# Patient Record
Sex: Female | Born: 1990 | Race: White | Hispanic: No | Marital: Single | Smoking: Never smoker
Health system: Southern US, Community
[De-identification: ages and names within clinical notes are randomized; demographics above are authoritative.]

## PROBLEM LIST (undated history)

## (undated) ENCOUNTER — Inpatient Hospital Stay: Payer: Self-pay

## (undated) DIAGNOSIS — O24419 Gestational diabetes mellitus in pregnancy, unspecified control: Secondary | ICD-10-CM

## (undated) DIAGNOSIS — C801 Malignant (primary) neoplasm, unspecified: Secondary | ICD-10-CM

## (undated) DIAGNOSIS — C439 Malignant melanoma of skin, unspecified: Secondary | ICD-10-CM

## (undated) DIAGNOSIS — R638 Other symptoms and signs concerning food and fluid intake: Secondary | ICD-10-CM

## (undated) HISTORY — DX: Malignant (primary) neoplasm, unspecified: C80.1

## (undated) HISTORY — PX: MELANOMA EXCISION: SHX5266

## (undated) HISTORY — DX: Malignant melanoma of skin, unspecified: C43.9

## (undated) HISTORY — DX: Other symptoms and signs concerning food and fluid intake: R63.8

## (undated) HISTORY — DX: Gestational diabetes mellitus in pregnancy, unspecified control: O24.419

---
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
## 2010-02-23 ENCOUNTER — Emergency Department: Payer: Self-pay | Admitting: Emergency Medicine

## 2011-10-11 ENCOUNTER — Emergency Department: Payer: Self-pay | Admitting: Emergency Medicine

## 2011-10-11 LAB — URINALYSIS, COMPLETE
Bacteria: NONE SEEN
Ketone: NEGATIVE
Ph: 6 (ref 4.5–8.0)
Protein: NEGATIVE
Squamous Epithelial: 4

## 2011-10-11 LAB — BASIC METABOLIC PANEL
BUN: 11 mg/dL (ref 7–18)
Calcium, Total: 8.2 mg/dL — ABNORMAL LOW (ref 8.5–10.1)
Chloride: 100 mmol/L (ref 98–107)
Co2: 25 mmol/L (ref 21–32)
EGFR (Non-African Amer.): 52 — ABNORMAL LOW
Potassium: 3 mmol/L — ABNORMAL LOW (ref 3.5–5.1)
Sodium: 134 mmol/L — ABNORMAL LOW (ref 136–145)

## 2011-10-11 LAB — CBC
HGB: 10.1 g/dL — ABNORMAL LOW (ref 12.0–16.0)
MCH: 30.9 pg (ref 26.0–34.0)
MCHC: 33.4 g/dL (ref 32.0–36.0)

## 2011-10-12 LAB — PREGNANCY, URINE: Pregnancy Test, Urine: NEGATIVE m[IU]/mL

## 2011-10-14 LAB — URINE CULTURE

## 2012-04-27 ENCOUNTER — Emergency Department: Payer: Self-pay | Admitting: Emergency Medicine

## 2012-07-12 DIAGNOSIS — F329 Major depressive disorder, single episode, unspecified: Secondary | ICD-10-CM | POA: Insufficient documentation

## 2013-02-24 ENCOUNTER — Emergency Department: Payer: Self-pay | Admitting: Emergency Medicine

## 2013-02-24 LAB — URINALYSIS, COMPLETE
Glucose,UR: NEGATIVE mg/dL (ref 0–75)
Ketone: NEGATIVE
Nitrite: NEGATIVE
Protein: 100
RBC,UR: 62 /HPF (ref 0–5)
Specific Gravity: 1.017 (ref 1.003–1.030)
Squamous Epithelial: 9
WBC UR: 4166 /HPF (ref 0–5)

## 2013-02-27 ENCOUNTER — Ambulatory Visit: Payer: Self-pay | Admitting: Family Medicine

## 2014-05-16 NOTE — L&D Delivery Note (Signed)
Delivery Note At 10:06 PM a viable female was delivered via  (Presentation: Left Occiput Anterior).  APGAR:8 ,9 ; weight  .   Placenta status: Intact, Spontaneous.  Cord: 3 vessels with the following complications: None.  Cord pH: NA  Anesthesia: Epidural  Episiotomy: None Lacerations: Periurethral Suture Repair: NA Est. Blood Loss (mL):  350 mL  Mom to postpartum.  Baby to Couplet care / Skin to Skin.  Hassell Done A Defrancesco 01/30/2015, 10:20 PM

## 2014-11-03 ENCOUNTER — Other Ambulatory Visit: Payer: Self-pay

## 2014-11-03 DIAGNOSIS — O4443 Low lying placenta NOS or without hemorrhage, third trimester: Secondary | ICD-10-CM

## 2014-11-03 DIAGNOSIS — O283 Abnormal ultrasonic finding on antenatal screening of mother: Secondary | ICD-10-CM

## 2014-11-07 ENCOUNTER — Other Ambulatory Visit: Payer: BLUE CROSS/BLUE SHIELD

## 2014-11-07 DIAGNOSIS — O4443 Low lying placenta NOS or without hemorrhage, third trimester: Secondary | ICD-10-CM

## 2014-11-07 DIAGNOSIS — O283 Abnormal ultrasonic finding on antenatal screening of mother: Secondary | ICD-10-CM | POA: Diagnosis not present

## 2014-11-07 DIAGNOSIS — O4403 Placenta previa specified as without hemorrhage, third trimester: Secondary | ICD-10-CM | POA: Diagnosis not present

## 2014-11-13 ENCOUNTER — Ambulatory Visit (INDEPENDENT_AMBULATORY_CARE_PROVIDER_SITE_OTHER): Payer: BLUE CROSS/BLUE SHIELD | Admitting: Obstetrics and Gynecology

## 2014-11-13 ENCOUNTER — Encounter: Payer: Self-pay | Admitting: Obstetrics and Gynecology

## 2014-11-13 VITALS — BP 98/62 | HR 79 | Wt 182.0 lb

## 2014-11-13 DIAGNOSIS — Z130 Encounter for screening for diseases of the blood and blood-forming organs and certain disorders involving the immune mechanism: Secondary | ICD-10-CM

## 2014-11-13 DIAGNOSIS — Z131 Encounter for screening for diabetes mellitus: Secondary | ICD-10-CM

## 2014-11-13 DIAGNOSIS — Z3493 Encounter for supervision of normal pregnancy, unspecified, third trimester: Secondary | ICD-10-CM

## 2014-11-13 LAB — POCT URINALYSIS DIPSTICK
Bilirubin, UA: NEGATIVE
Blood, UA: NEGATIVE
GLUCOSE UA: NEGATIVE
Ketones, UA: NEGATIVE
Nitrite, UA: NEGATIVE
PROTEIN UA: NEGATIVE
Spec Grav, UA: 1.01
UROBILINOGEN UA: 0.2
pH, UA: 7.5

## 2014-11-13 MED ORDER — TETANUS-DIPHTH-ACELL PERTUSSIS 5-2.5-18.5 LF-MCG/0.5 IM SUSP
0.5000 mL | Freq: Once | INTRAMUSCULAR | Status: AC
  Administered 2014-11-13: 0.5 mL via INTRAMUSCULAR

## 2014-11-13 NOTE — Progress Notes (Signed)
No compaints.GTT and H/H today.

## 2014-11-15 LAB — HEMOGLOBIN AND HEMATOCRIT, BLOOD
Hematocrit: 34.4 % (ref 34.0–46.6)
Hemoglobin: 11.4 g/dL (ref 11.1–15.9)

## 2014-11-15 LAB — GLUCOSE, 1 HOUR GESTATIONAL: Gestational Diabetes Screen: 100 mg/dL (ref 65–139)

## 2014-11-27 ENCOUNTER — Ambulatory Visit (INDEPENDENT_AMBULATORY_CARE_PROVIDER_SITE_OTHER): Payer: BLUE CROSS/BLUE SHIELD | Admitting: Obstetrics and Gynecology

## 2014-11-27 ENCOUNTER — Encounter: Payer: Self-pay | Admitting: Obstetrics and Gynecology

## 2014-11-27 VITALS — BP 105/68 | HR 74 | Wt 181.1 lb

## 2014-11-27 DIAGNOSIS — Z1389 Encounter for screening for other disorder: Secondary | ICD-10-CM

## 2014-11-27 DIAGNOSIS — Z331 Pregnant state, incidental: Secondary | ICD-10-CM

## 2014-11-27 LAB — POCT URINALYSIS DIPSTICK
BILIRUBIN UA: NEGATIVE
Blood, UA: NEGATIVE
Glucose, UA: NEGATIVE
Ketones, UA: NEGATIVE
LEUKOCYTES UA: NEGATIVE
NITRITE UA: NEGATIVE
PH UA: 6.5
Protein, UA: NEGATIVE
SPEC GRAV UA: 1.02
Urobilinogen, UA: NEGATIVE

## 2014-11-27 NOTE — Progress Notes (Signed)
Glucola results 100; H&H: wnl.

## 2014-12-10 ENCOUNTER — Ambulatory Visit (INDEPENDENT_AMBULATORY_CARE_PROVIDER_SITE_OTHER): Payer: BLUE CROSS/BLUE SHIELD | Admitting: Obstetrics and Gynecology

## 2014-12-10 VITALS — BP 111/72 | HR 79 | Wt 181.3 lb

## 2014-12-10 DIAGNOSIS — Z3493 Encounter for supervision of normal pregnancy, unspecified, third trimester: Secondary | ICD-10-CM

## 2014-12-10 DIAGNOSIS — O4703 False labor before 37 completed weeks of gestation, third trimester: Secondary | ICD-10-CM

## 2014-12-10 DIAGNOSIS — Z331 Pregnant state, incidental: Secondary | ICD-10-CM

## 2014-12-10 DIAGNOSIS — Z1389 Encounter for screening for other disorder: Secondary | ICD-10-CM

## 2014-12-10 LAB — POCT URINALYSIS DIPSTICK
BILIRUBIN UA: NEGATIVE
Blood, UA: NEGATIVE
Glucose, UA: NEGATIVE
KETONES UA: NEGATIVE
Nitrite, UA: NEGATIVE
PROTEIN UA: NEGATIVE
Spec Grav, UA: 1.01
UROBILINOGEN UA: NEGATIVE
pH, UA: 6.5

## 2014-12-11 ENCOUNTER — Other Ambulatory Visit: Payer: Self-pay | Admitting: Obstetrics and Gynecology

## 2014-12-12 DIAGNOSIS — O4703 False labor before 37 completed weeks of gestation, third trimester: Secondary | ICD-10-CM | POA: Insufficient documentation

## 2014-12-12 LAB — FETAL FIBRONECTIN: Fetal Fibronectin: NEGATIVE

## 2014-12-12 NOTE — Progress Notes (Signed)
Having irregular contractions; cervix: Fingertip/30%/-3/soft; fetal fibronectin obtained.  Preterm labor precautions given. Return in 2 weeks.

## 2014-12-17 ENCOUNTER — Telehealth: Payer: Self-pay | Admitting: Obstetrics and Gynecology

## 2014-12-17 NOTE — Telephone Encounter (Signed)
Pt has pain lower left pelvic pain. Had to leave work, can't hardly walk with the pain. 34 wks .  Please call pt with advice

## 2014-12-18 NOTE — Telephone Encounter (Signed)
Left message to contact office and ask to speak to me. I called L&D and they have no record of her coming in yesterday or today.

## 2014-12-18 NOTE — Telephone Encounter (Signed)
Left message to contact office and for staff to get me to phone when she calls.

## 2014-12-19 NOTE — Telephone Encounter (Signed)
Left message for pt that I was just calling to check on her and that I see she has an appt next week. Please call office if there is anything that we can help with.

## 2014-12-25 ENCOUNTER — Ambulatory Visit (INDEPENDENT_AMBULATORY_CARE_PROVIDER_SITE_OTHER): Payer: BLUE CROSS/BLUE SHIELD | Admitting: Obstetrics and Gynecology

## 2014-12-25 ENCOUNTER — Encounter: Payer: Self-pay | Admitting: Obstetrics and Gynecology

## 2014-12-25 VITALS — BP 102/66 | HR 68 | Ht 66.0 in | Wt 182.9 lb

## 2014-12-25 DIAGNOSIS — Z3493 Encounter for supervision of normal pregnancy, unspecified, third trimester: Secondary | ICD-10-CM

## 2014-12-25 DIAGNOSIS — O26893 Other specified pregnancy related conditions, third trimester: Secondary | ICD-10-CM

## 2014-12-25 DIAGNOSIS — N949 Unspecified condition associated with female genital organs and menstrual cycle: Secondary | ICD-10-CM

## 2014-12-25 LAB — POCT URINALYSIS DIPSTICK
Bilirubin, UA: NEGATIVE
Blood, UA: NEGATIVE
GLUCOSE UA: NEGATIVE
Ketones, UA: NEGATIVE
Leukocytes, UA: NEGATIVE
Nitrite, UA: NEGATIVE
PH UA: 7.5
PROTEIN UA: NEGATIVE
SPEC GRAV UA: 1.01
Urobilinogen, UA: 0.2

## 2014-12-25 NOTE — Progress Notes (Signed)
ROB: Patient still notes irregular contractions.  Also reports worsening pain on sides while working during the day. Advised on pregnancy girdle. Cervix unchanged from prior exam. Notes numbness in hands and feet when waking, likely secondary to swelling.  RTC in 2 weeks. For 36 week labs at that time.  PTL precautions given.

## 2014-12-25 NOTE — Progress Notes (Signed)
Pt c/o of numbness in hands and feet. Pelvic pressure.

## 2015-01-04 ENCOUNTER — Observation Stay
Admission: EM | Admit: 2015-01-04 | Discharge: 2015-01-04 | Disposition: A | Discharge status: Home / Self Care | Payer: BLUE CROSS/BLUE SHIELD | Attending: Obstetrics and Gynecology | Admitting: Obstetrics and Gynecology

## 2015-01-04 ENCOUNTER — Encounter: Payer: Self-pay | Admitting: *Deleted

## 2015-01-04 DIAGNOSIS — Z3A36 36 weeks gestation of pregnancy: Secondary | ICD-10-CM | POA: Diagnosis not present

## 2015-01-04 DIAGNOSIS — O4400 Placenta previa specified as without hemorrhage, unspecified trimester: Secondary | ICD-10-CM

## 2015-01-04 DIAGNOSIS — R638 Other symptoms and signs concerning food and fluid intake: Secondary | ICD-10-CM | POA: Diagnosis not present

## 2015-01-04 DIAGNOSIS — O283 Abnormal ultrasonic finding on antenatal screening of mother: Secondary | ICD-10-CM

## 2015-01-04 DIAGNOSIS — Z3490 Encounter for supervision of normal pregnancy, unspecified, unspecified trimester: Secondary | ICD-10-CM

## 2015-01-04 NOTE — Progress Notes (Signed)
Report given to Dr. Enzo Bi.  Order received.

## 2015-01-04 NOTE — Progress Notes (Signed)
Pt given d/c inst. Verbalized understanding and was then d/c home in stable condition with husband.

## 2015-01-04 NOTE — Discharge Instructions (Signed)
Keep next appointment at office on Thursday.  Drink additional water .

## 2015-01-05 NOTE — Progress Notes (Signed)
NST INTERPRETATION:  Indications:  1.  36 week IUP with contractions  Mode: External Baseline Rate (A): 130 bpm Variability: Moderate Accelerations: 15 x 15 Decelerations: None     Contraction Frequency (min): Occasional   Impression:  1.  36 week IUP with contractions. 2.  Reactive NST. 3.  No labor.   Plan:  1.  Labor precautions. 2.  Keep appointment as scheduled    Brayton Mars, MD

## 2015-01-08 ENCOUNTER — Ambulatory Visit (INDEPENDENT_AMBULATORY_CARE_PROVIDER_SITE_OTHER): Payer: BLUE CROSS/BLUE SHIELD | Admitting: Obstetrics and Gynecology

## 2015-01-08 ENCOUNTER — Encounter: Payer: Self-pay | Admitting: Obstetrics and Gynecology

## 2015-01-08 VITALS — BP 125/80 | HR 84 | Wt 189.8 lb

## 2015-01-08 DIAGNOSIS — Z3493 Encounter for supervision of normal pregnancy, unspecified, third trimester: Secondary | ICD-10-CM

## 2015-01-08 DIAGNOSIS — Z113 Encounter for screening for infections with a predominantly sexual mode of transmission: Secondary | ICD-10-CM

## 2015-01-08 DIAGNOSIS — Z36 Encounter for antenatal screening of mother: Secondary | ICD-10-CM

## 2015-01-08 DIAGNOSIS — Z3685 Encounter for antenatal screening for Streptococcus B: Secondary | ICD-10-CM

## 2015-01-08 LAB — POCT URINALYSIS DIPSTICK
Bilirubin, UA: NEGATIVE
Blood, UA: NEGATIVE
GLUCOSE UA: NEGATIVE
Ketones, UA: NEGATIVE
Nitrite, UA: NEGATIVE
PROTEIN UA: NEGATIVE
Spec Grav, UA: 1.01
UROBILINOGEN UA: 0.2
pH, UA: 6

## 2015-01-08 NOTE — Progress Notes (Signed)
36 week cultures today. Increased in d/c.

## 2015-01-11 LAB — STREP GP B NAA: STREP GROUP B AG: NEGATIVE

## 2015-01-12 LAB — GC/CHLAMYDIA PROBE AMP
CHLAMYDIA, DNA PROBE: NEGATIVE
NEISSERIA GONORRHOEAE BY PCR: NEGATIVE

## 2015-01-15 ENCOUNTER — Ambulatory Visit (INDEPENDENT_AMBULATORY_CARE_PROVIDER_SITE_OTHER): Payer: BLUE CROSS/BLUE SHIELD | Admitting: Obstetrics and Gynecology

## 2015-01-15 VITALS — BP 117/77 | HR 76 | Wt 186.4 lb

## 2015-01-15 DIAGNOSIS — Z3493 Encounter for supervision of normal pregnancy, unspecified, third trimester: Secondary | ICD-10-CM

## 2015-01-15 DIAGNOSIS — Z331 Pregnant state, incidental: Secondary | ICD-10-CM

## 2015-01-15 DIAGNOSIS — Z1389 Encounter for screening for other disorder: Secondary | ICD-10-CM

## 2015-01-15 LAB — POCT URINALYSIS DIPSTICK
BILIRUBIN UA: NEGATIVE
Glucose, UA: NEGATIVE
Ketones, UA: NEGATIVE
NITRITE UA: NEGATIVE
PH UA: 7
Protein, UA: NEGATIVE
RBC UA: NEGATIVE
SPEC GRAV UA: 1.015
Urobilinogen, UA: NEGATIVE

## 2015-01-15 NOTE — Progress Notes (Signed)
No changes since last visit. 

## 2015-01-22 ENCOUNTER — Encounter: Payer: Self-pay | Admitting: Obstetrics and Gynecology

## 2015-01-22 ENCOUNTER — Ambulatory Visit (INDEPENDENT_AMBULATORY_CARE_PROVIDER_SITE_OTHER): Payer: BLUE CROSS/BLUE SHIELD | Admitting: Obstetrics and Gynecology

## 2015-01-22 VITALS — BP 122/80 | HR 84 | Wt 188.7 lb

## 2015-01-22 DIAGNOSIS — Z3493 Encounter for supervision of normal pregnancy, unspecified, third trimester: Secondary | ICD-10-CM

## 2015-01-22 LAB — POCT URINALYSIS DIPSTICK
BILIRUBIN UA: NEGATIVE
GLUCOSE UA: NEGATIVE
KETONES UA: NEGATIVE
Nitrite, UA: NEGATIVE
PH UA: 6
Protein, UA: NEGATIVE
RBC UA: NEGATIVE
Spec Grav, UA: 1.02
Urobilinogen, UA: 0.2

## 2015-01-22 NOTE — Progress Notes (Signed)
No complaints.Cervix is thinning and less posterior; labor precautions

## 2015-01-29 ENCOUNTER — Ambulatory Visit (INDEPENDENT_AMBULATORY_CARE_PROVIDER_SITE_OTHER): Payer: BLUE CROSS/BLUE SHIELD | Admitting: Obstetrics and Gynecology

## 2015-01-29 ENCOUNTER — Inpatient Hospital Stay
Admission: EM | Admit: 2015-01-29 | Discharge: 2015-02-01 | DRG: 775 | Disposition: A | Discharge status: Home / Self Care | Payer: BLUE CROSS/BLUE SHIELD | Attending: Obstetrics and Gynecology | Admitting: Obstetrics and Gynecology

## 2015-01-29 ENCOUNTER — Encounter: Payer: Self-pay | Admitting: Obstetrics and Gynecology

## 2015-01-29 VITALS — BP 128/91 | HR 71 | Wt 193.9 lb

## 2015-01-29 DIAGNOSIS — Z3490 Encounter for supervision of normal pregnancy, unspecified, unspecified trimester: Secondary | ICD-10-CM

## 2015-01-29 DIAGNOSIS — Z3A39 39 weeks gestation of pregnancy: Secondary | ICD-10-CM | POA: Diagnosis present

## 2015-01-29 DIAGNOSIS — Z3493 Encounter for supervision of normal pregnancy, unspecified, third trimester: Secondary | ICD-10-CM

## 2015-01-29 DIAGNOSIS — O133 Gestational [pregnancy-induced] hypertension without significant proteinuria, third trimester: Principal | ICD-10-CM | POA: Diagnosis present

## 2015-01-29 DIAGNOSIS — Z9889 Other specified postprocedural states: Secondary | ICD-10-CM

## 2015-01-29 LAB — POCT URINALYSIS DIPSTICK
BILIRUBIN UA: NEGATIVE
Blood, UA: NEGATIVE
GLUCOSE UA: NEGATIVE
KETONES UA: NEGATIVE
Nitrite, UA: NEGATIVE
Protein, UA: NEGATIVE
Urobilinogen, UA: 0.2
pH, UA: 7.5

## 2015-01-29 MED ORDER — INFLUENZA VAC SPLIT QUAD 0.5 ML IM SUSY
0.5000 mL | PREFILLED_SYRINGE | Freq: Once | INTRAMUSCULAR | Status: AC
  Administered 2015-01-29: 0.5 mL via INTRAMUSCULAR

## 2015-01-29 NOTE — Progress Notes (Signed)
Increase d/c- white no odor. Having h/a, sweling- legs, hand, feet. White spots on hands, thighs, and feet.  Tingling in the hand. 5# wt gain since LV. Flu vaccine given.Patient is to go to  labor and delivery for Cytotec/Pitocin induction of labor.

## 2015-01-29 NOTE — H&P (Signed)
Dawn Morris is a 24 y.o. female presenting for Cytotec/Pitocin induction of labor secondary to gestational hypertension at 39.[redacted] weeks gestation.  EDC is 01/30/2015.  Prenatal course has been unremarkable except for increased BMI, and an echogenic intracardiac focus that was seen on fetal heart.  No other aneuploidy markers were seen on ultrasound. Patient has been experiencing intermittent headache, scotomata, and decreased fetal movement.  She has gained 5 pounds in the past week and had an elevated blood pressure in the office at 128/91.  Urine protein was negative in the office.  Clinical exam was notable for 3+  Reflexes and 2+ edema.  Estimated fetal weight is 7 lbs. 8 oz. Low belowMaternal Medical History:  Reason for admission: Nausea.    OB History    Gravida Para Term Preterm AB TAB SAB Ectopic Multiple Living   1              Past Medical History  Diagnosis Date  . Increased BMI   . Low-lying placenta   . Echogenic intracardiac focus of fetus on prenatal ultrasound     09/10/2014   Past Surgical History  Procedure Laterality Date  . Tonsillectomy  2005   Family History: family history is negative for Cancer, Diabetes, and Heart disease. Social History:  reports that she has never smoked. She does not have any smokeless tobacco history on file. She reports that she does not drink alcohol or use illicit drugs.   Prenatal Transfer Tool  Maternal Diabetes: No Genetic Screening: Normal Maternal Ultrasounds/Referrals: Normal Fetal Ultrasounds or other Referrals:  Other:  Maternal Substance Abuse:  No Significant Maternal Medications:  None Significant Maternal Lab Results:  None Other Comments:  None  Review of Systems  Constitutional: Negative.   Eyes: Positive for blurred vision.  Respiratory: Negative.   Cardiovascular: Negative.   Gastrointestinal: Positive for abdominal pain. Negative for heartburn, nausea and vomiting.  Genitourinary: Negative.    Musculoskeletal: Negative.   Neurological: Positive for headaches.  Psychiatric/Behavioral: Negative.       Blood pressure 128/91, pulse 71, weight 193 lb 14.4 oz (87.952 kg), last menstrual period 04/29/2014. Exam Physical Exam  Constitutional: She is oriented to person, place, and time. She appears well-nourished.  HENT:  Head: Normocephalic and atraumatic.  Eyes: No scleral icterus.  Neck: Neck supple.  Cardiovascular: Normal rate and regular rhythm.   Respiratory: No respiratory distress.  GI: She exhibits no distension. There is no tenderness.  Musculoskeletal: She exhibits edema. She exhibits no tenderness.  Neurological: She is alert and oriented to person, place, and time. She displays abnormal reflex. No cranial nerve deficit.  Skin: Skin is warm and dry. No rash noted. She is not diaphoretic. There is erythema.  Psychiatric: She has a normal mood and affect. Thought content normal.     Fundal height 35 cm Estimated fetal weight 7 lbs. 8 oz. Cervix: 1.5/30/-3/vertex/bag of water intact. Pelvis:  Gynecoid. Extremities: DTR 3+/4; edema, 1+ Prenatal labs: ABO, Rh:   Antibody:  Negative Rubella:  Immune RPR:   Nonreactive HBsAg:   Negative HIV:   Negative GBS:   Negative Glucola: 132  Assessment/Plan: 39.6 week IUP with gestational hypertension. GBS negative.  Admitted for Cytotec/Pitocin induction of labor. PIH panel, urine protein/ creatinine ratio Epidural when necessary  Hassell Done A Kindle Strohmeier 01/29/2015, 5:02 PM

## 2015-01-30 ENCOUNTER — Inpatient Hospital Stay: Payer: BLUE CROSS/BLUE SHIELD | Admitting: Anesthesiology

## 2015-01-30 ENCOUNTER — Encounter: Payer: Self-pay | Admitting: *Deleted

## 2015-01-30 DIAGNOSIS — Z3A39 39 weeks gestation of pregnancy: Secondary | ICD-10-CM | POA: Diagnosis present

## 2015-01-30 DIAGNOSIS — Z9889 Other specified postprocedural states: Secondary | ICD-10-CM | POA: Diagnosis present

## 2015-01-30 DIAGNOSIS — O133 Gestational [pregnancy-induced] hypertension without significant proteinuria, third trimester: Secondary | ICD-10-CM | POA: Diagnosis present

## 2015-01-30 LAB — PLATELET COUNT: Platelets: 144 10*3/uL — ABNORMAL LOW (ref 150–440)

## 2015-01-30 LAB — PROTEIN / CREATININE RATIO, URINE
Creatinine, Urine: 75 mg/dL
Protein Creatinine Ratio: 0.08 mg/mg{Cre} (ref 0.00–0.15)
TOTAL PROTEIN, URINE: 6 mg/dL

## 2015-01-30 LAB — COMPREHENSIVE METABOLIC PANEL
ALK PHOS: 121 U/L (ref 38–126)
ALT: 14 U/L (ref 14–54)
AST: 24 U/L (ref 15–41)
Albumin: 3.3 g/dL — ABNORMAL LOW (ref 3.5–5.0)
Anion gap: 10 (ref 5–15)
BUN: 9 mg/dL (ref 6–20)
CALCIUM: 8.9 mg/dL (ref 8.9–10.3)
CO2: 21 mmol/L — AB (ref 22–32)
CREATININE: 0.63 mg/dL (ref 0.44–1.00)
Chloride: 104 mmol/L (ref 101–111)
GFR calc non Af Amer: >60 mL/min (ref >60)
Glucose, Bld: 78 mg/dL (ref 65–99)
Potassium: 3.5 mmol/L (ref 3.5–5.1)
SODIUM: 135 mmol/L (ref 135–145)
Total Bilirubin: 0.6 mg/dL (ref 0.3–1.2)
Total Protein: 6.4 g/dL — ABNORMAL LOW (ref 6.5–8.1)

## 2015-01-30 LAB — CBC
HCT: 35.3 % (ref 35.0–47.0)
HEMOGLOBIN: 12.2 g/dL (ref 12.0–16.0)
MCH: 32.3 pg (ref 26.0–34.0)
MCHC: 34.6 g/dL (ref 32.0–36.0)
MCV: 93.5 fL (ref 80.0–100.0)
Platelets: 173 10*3/uL (ref 150–440)
RBC: 3.78 MIL/uL — AB (ref 3.80–5.20)
RDW: 13 % (ref 11.5–14.5)
WBC: 15.6 10*3/uL — ABNORMAL HIGH (ref 3.6–11.0)

## 2015-01-30 LAB — URIC ACID: Uric Acid, Serum: 4.8 mg/dL (ref 2.3–6.6)

## 2015-01-30 MED ORDER — OXYCODONE-ACETAMINOPHEN 5-325 MG PO TABS: 1.0000 | ORAL_TABLET | ORAL | Status: DC | PRN

## 2015-01-30 MED ORDER — LIDOCAINE HCL (PF) 1 % IJ SOLN
30.0000 mL | INTRAMUSCULAR | Status: DC | PRN
  Filled 2015-01-30: qty 30

## 2015-01-30 MED ORDER — DIPHENHYDRAMINE HCL 50 MG/ML IJ SOLN: 12.5000 mg | INTRAMUSCULAR | Status: DC | PRN

## 2015-01-30 MED ORDER — EPHEDRINE 5 MG/ML INJ
10.0000 mg | INTRAVENOUS | Status: DC | PRN
  Filled 2015-01-30: qty 2

## 2015-01-30 MED ORDER — ONDANSETRON HCL 4 MG/2ML IJ SOLN
4.0000 mg | Freq: Four times a day (QID) | INTRAMUSCULAR | Status: DC | PRN
  Filled 2015-01-30: qty 2

## 2015-01-30 MED ORDER — OXYTOCIN 40 UNITS IN LACTATED RINGERS INFUSION - SIMPLE MED: 62.5000 mL/h | INTRAVENOUS | Status: DC

## 2015-01-30 MED ORDER — PHENYLEPHRINE 40 MCG/ML (10ML) SYRINGE FOR IV PUSH (FOR BLOOD PRESSURE SUPPORT)
80.0000 ug | PREFILLED_SYRINGE | INTRAVENOUS | Status: DC | PRN
  Filled 2015-01-30: qty 2

## 2015-01-30 MED ORDER — ACETAMINOPHEN 325 MG PO TABS
650.0000 mg | ORAL_TABLET | ORAL | Status: DC | PRN
  Administered 2015-01-31: 650 mg via ORAL
  Filled 2015-01-30: qty 2

## 2015-01-30 MED ORDER — LACTATED RINGERS IV SOLN
INTRAVENOUS | Status: DC
  Administered 2015-01-30 (×2): via INTRAVENOUS

## 2015-01-30 MED ORDER — FENTANYL 2.5 MCG/ML W/ROPIVACAINE 0.2% IN NS 100 ML EPIDURAL INFUSION (ARMC-ANES): 10.0000 mL/h | EPIDURAL | Status: DC

## 2015-01-30 MED ORDER — OXYTOCIN 40 UNITS IN LACTATED RINGERS INFUSION - SIMPLE MED
INTRAVENOUS | Status: AC
  Administered 2015-01-30: 1 m[IU]/min via INTRAVENOUS
  Filled 2015-01-30: qty 1000

## 2015-01-30 MED ORDER — CITRIC ACID-SODIUM CITRATE 334-500 MG/5ML PO SOLN: 30.0000 mL | ORAL | Status: DC | PRN

## 2015-01-30 MED ORDER — OXYCODONE-ACETAMINOPHEN 5-325 MG PO TABS: 2.0000 | ORAL_TABLET | ORAL | Status: DC | PRN

## 2015-01-30 MED ORDER — MISOPROSTOL 25 MCG QUARTER TABLET
50.0000 ug | ORAL_TABLET | ORAL | Status: DC | PRN
  Administered 2015-01-30: 50 ug via VAGINAL
  Filled 2015-01-30: qty 1

## 2015-01-30 MED ORDER — FENTANYL 2.5 MCG/ML W/ROPIVACAINE 0.2% IN NS 100 ML EPIDURAL INFUSION (ARMC-ANES)
EPIDURAL | Status: DC | PRN
  Administered 2015-01-30: 10 mL/h via EPIDURAL

## 2015-01-30 MED ORDER — TERBUTALINE SULFATE 1 MG/ML IJ SOLN: 0.2500 mg | Freq: Once | INTRAMUSCULAR | Status: DC | PRN

## 2015-01-30 MED ORDER — OXYTOCIN BOLUS FROM INFUSION: 500.0000 mL | INTRAVENOUS | Status: DC

## 2015-01-30 MED ORDER — FENTANYL 2.5 MCG/ML W/ROPIVACAINE 0.2% IN NS 100 ML EPIDURAL INFUSION (ARMC-ANES)
EPIDURAL | Status: AC
  Filled 2015-01-30: qty 100

## 2015-01-30 MED ORDER — FENTANYL CITRATE (PF) 100 MCG/2ML IJ SOLN
50.0000 ug | INTRAMUSCULAR | Status: DC | PRN
  Administered 2015-01-30: 50 ug via INTRAVENOUS
  Filled 2015-01-30: qty 2

## 2015-01-30 MED ORDER — LACTATED RINGERS IV SOLN: 500.0000 mL | INTRAVENOUS | Status: DC | PRN

## 2015-01-30 MED ORDER — BUPIVACAINE HCL (PF) 0.25 % IJ SOLN
INTRAMUSCULAR | Status: DC | PRN
  Administered 2015-01-30: 5 mL via EPIDURAL

## 2015-01-30 MED ORDER — OXYTOCIN 40 UNITS IN LACTATED RINGERS INFUSION - SIMPLE MED
1.0000 m[IU]/min | INTRAVENOUS | Status: DC
  Administered 2015-01-30: 3 m[IU]/min via INTRAVENOUS
  Administered 2015-01-30: 11 m[IU]/min via INTRAVENOUS
  Administered 2015-01-30: 7 m[IU]/min via INTRAVENOUS
  Administered 2015-01-30: 9 m[IU]/min via INTRAVENOUS
  Administered 2015-01-30: 7 m[IU]/min via INTRAVENOUS
  Administered 2015-01-30: 1 m[IU]/min via INTRAVENOUS
  Administered 2015-01-30: 3 m[IU]/min via INTRAVENOUS
  Administered 2015-01-30 (×2): 5 m[IU]/min via INTRAVENOUS

## 2015-01-30 NOTE — Anesthesia Procedure Notes (Signed)
Epidural Patient location during procedure: OB  Staffing Anesthesiologist: Gunnar Bulla Performed by: anesthesiologist   Preanesthetic Checklist Completed: patient identified, site marked, surgical consent, pre-op evaluation, timeout performed, IV checked, risks and benefits discussed and monitors and equipment checked  Epidural Patient position: sitting Prep: Betadine Patient monitoring: heart rate, continuous pulse ox and blood pressure Approach: midline Location: L4-L5 Injection technique: LOR saline  Needle:  Needle type: Tuohy  Needle gauge: 18 G Needle length: 9 cm and 9 Catheter type: closed end flexible Catheter size: 20 Guage Test dose: negative and 1.5% lidocaine with Epi 1:200 K  Assessment Sensory level: T10 Events: blood not aspirated, injection not painful, no injection resistance, negative IV test and no paresthesia  Additional Notes   Patient tolerated the insertion well without complications. Catheter in at 1450. Test dose at 1451. 5 ml of .25%marcaine at 1454. Infusion started at 1500.Reason for block:procedure for pain

## 2015-01-30 NOTE — Progress Notes (Signed)
S: Epidural working O: BP 123/75 mmHg  Pulse 90  Temp(Src) 98.4 F (36.9 C) (Oral)  Resp 20  Ht 5\' 5"  (1.651 m)  Wt 185 lb (83.915 kg)  BMI 30.79 kg/m2  LMP 04/29/2014     Cx: Anterior Lip/90/0     FHR: Category 2     AF Clear A: Good progress P: Continue Pitocin.  Brayton Mars, MD

## 2015-01-30 NOTE — Progress Notes (Signed)
S: Pt comfortable with epidural O: BP 119/70 mmHg  Pulse 75  Temp(Src) 98.5 F (36.9 C) (Axillary)  Resp 16  Ht 5\' 5"  (1.651 m)  Wt 185 lb (83.915 kg)  BMI 30.79 kg/m2  LMP 04/29/2014 FHR: Category 1 Cervix: 5/90/0/FSE placed A: Progress P: Continue Pitocin Augmentation  Brayton Mars, MD

## 2015-01-30 NOTE — Progress Notes (Signed)
S: Moderate discomfort with CTX's O: BP 123/77 mmHg  Pulse 80  Temp(Src) 98.5 F (36.9 C) (Axillary)  Resp 16  Ht 5\' 5"  (1.651 m)  Wt 185 lb (83.915 kg)  BMI 30.79 kg/m2  LMP 04/29/2014 Cx: 3/90/-2/AROM clear/IUPC placed FHR: Category 1 A: Labor progress P:  Continue Pitocin Augmentation

## 2015-01-30 NOTE — Progress Notes (Signed)
Dawn Morris is a 24 y.o. G1P0 at [redacted]w[redacted]d by ultrasound admitted for induction of labor due to Gestational Hypertension.  Subjective: No PIH sxs.   Objective: BP 116/79 mmHg  Pulse 78  Temp(Src) 97.9 F (36.6 C) (Axillary)  Resp 16  Ht 5\' 5"  (1.651 m)  Wt 185 lb (83.915 kg)  BMI 30.79 kg/m2  LMP 04/29/2014      Fetal Wellbeing:  Category I UC:   irregular SVE:   Dilation: 1.5 Effacement (%): 70 Station: -2 Exam by:: HFC  Labs: Lab Results  Component Value Date   WBC 15.6* 01/30/2015   HGB 12.2 01/30/2015   HCT 35.3 01/30/2015   MCV 93.5 01/30/2015   PLT 173 01/30/2015   CMP Latest Ref Rng 01/30/2015 10/11/2011  Glucose 65 - 99 mg/dL 78 132(H)  BUN 6 - 20 mg/dL 9 11  Creatinine 0.44 - 1.00 mg/dL 0.63 1.44(H)  Sodium 135 - 145 mmol/L 135 134(L)  Potassium 3.5 - 5.1 mmol/L 3.5 3.0(L)  Chloride 101 - 111 mmol/L 104 100  CO2 22 - 32 mmol/L 21(L) 25  Calcium 8.9 - 10.3 mg/dL 8.9 8.2(L)  Total Protein 6.5 - 8.1 g/dL 6.4(L) -  Total Bilirubin 0.3 - 1.2 mg/dL 0.6 -  Alkaline Phos 38 - 126 U/L 121 -  AST 15 - 41 U/L 24 -  ALT 14 - 54 U/L 14 -    Assessment / Plan: Induction of labor due to Gestational Htn,  progressing well on pitocin  Labor: Progressing on Pitocin, will continue to increase then AROM and EPIDURAL prn  Hassell Done A Defrancesco 01/30/2015, 9:35 AM

## 2015-01-30 NOTE — Anesthesia Preprocedure Evaluation (Signed)
Anesthesia Evaluation  Patient identified by MRN, date of birth, ID band Patient awake    Reviewed: Allergy & Precautions, NPO status , Patient's Chart, lab work & pertinent test results, reviewed documented beta blocker date and time   Airway Mallampati: II  TM Distance: >3 FB     Dental  (+) Chipped   Pulmonary           Cardiovascular      Neuro/Psych PSYCHIATRIC DISORDERS Depression    GI/Hepatic   Endo/Other    Renal/GU      Musculoskeletal   Abdominal   Peds  Hematology   Anesthesia Other Findings   Reproductive/Obstetrics                             Anesthesia Physical Anesthesia Plan  ASA: II  Anesthesia Plan: Epidural   Post-op Pain Management:    Induction:   Airway Management Planned:   Additional Equipment:   Intra-op Plan:   Post-operative Plan:   Informed Consent: I have reviewed the patients History and Physical, chart, labs and discussed the procedure including the risks, benefits and alternatives for the proposed anesthesia with the patient or authorized representative who has indicated his/her understanding and acceptance.     Plan Discussed with:   Anesthesia Plan Comments:         Anesthesia Quick Evaluation

## 2015-01-31 LAB — CBC
HCT: 33.7 % — ABNORMAL LOW (ref 35.0–47.0)
HEMOGLOBIN: 11.4 g/dL — AB (ref 12.0–16.0)
MCH: 32 pg (ref 26.0–34.0)
MCHC: 33.9 g/dL (ref 32.0–36.0)
MCV: 94.4 fL (ref 80.0–100.0)
Platelets: 157 10*3/uL (ref 150–440)
RBC: 3.57 MIL/uL — AB (ref 3.80–5.20)
RDW: 13 % (ref 11.5–14.5)
WBC: 19.9 10*3/uL — ABNORMAL HIGH (ref 3.6–11.0)

## 2015-01-31 LAB — TYPE AND SCREEN
ABO/RH(D): O POS
Antibody Screen: NEGATIVE

## 2015-01-31 LAB — RPR: RPR Ser Ql: NONREACTIVE

## 2015-01-31 LAB — ABO/RH: ABO/RH(D): O POS

## 2015-01-31 MED ORDER — WITCH HAZEL-GLYCERIN EX PADS: 1.0000 "application " | MEDICATED_PAD | CUTANEOUS | Status: DC | PRN

## 2015-01-31 MED ORDER — BENZOCAINE-MENTHOL 20-0.5 % EX AERO: 1.0000 "application " | INHALATION_SPRAY | CUTANEOUS | Status: DC | PRN

## 2015-01-31 MED ORDER — DIBUCAINE 1 % RE OINT: 1.0000 "application " | TOPICAL_OINTMENT | RECTAL | Status: DC | PRN

## 2015-01-31 MED ORDER — PRENATAL MULTIVITAMIN CH
1.0000 | ORAL_TABLET | Freq: Every day | ORAL | Status: DC
  Administered 2015-02-01: 1 via ORAL
  Filled 2015-01-31: qty 1

## 2015-01-31 MED ORDER — IBUPROFEN 800 MG PO TABS
800.0000 mg | ORAL_TABLET | Freq: Three times a day (TID) | ORAL | Status: DC
Start: 2015-01-31 — End: 2015-02-01
  Administered 2015-01-31 – 2015-02-01 (×4): 800 mg via ORAL
  Filled 2015-01-31 (×4): qty 1

## 2015-01-31 MED ORDER — TETANUS-DIPHTH-ACELL PERTUSSIS 5-2.5-18.5 LF-MCG/0.5 IM SUSP: 0.5000 mL | INTRAMUSCULAR | Status: DC | PRN

## 2015-01-31 MED ORDER — MEASLES, MUMPS & RUBELLA VAC ~~LOC~~ INJ: 0.5000 mL | INJECTION | Freq: Once | SUBCUTANEOUS | Status: DC

## 2015-01-31 MED ORDER — DIPHENHYDRAMINE HCL 25 MG PO CAPS: 25.0000 mg | ORAL_CAPSULE | Freq: Four times a day (QID) | ORAL | Status: DC | PRN

## 2015-01-31 MED ORDER — ONDANSETRON HCL 4 MG/2ML IJ SOLN: 4.0000 mg | INTRAMUSCULAR | Status: DC | PRN

## 2015-01-31 MED ORDER — SIMETHICONE 80 MG PO CHEW: 80.0000 mg | CHEWABLE_TABLET | ORAL | Status: DC | PRN

## 2015-01-31 MED ORDER — OXYCODONE-ACETAMINOPHEN 5-325 MG PO TABS: 2.0000 | ORAL_TABLET | ORAL | Status: DC | PRN

## 2015-01-31 MED ORDER — ACETAMINOPHEN 325 MG PO TABS
650.0000 mg | ORAL_TABLET | ORAL | Status: DC | PRN
  Administered 2015-01-31 – 2015-02-01 (×2): 650 mg via ORAL
  Filled 2015-01-31 (×2): qty 2

## 2015-01-31 MED ORDER — DOCUSATE SODIUM 100 MG PO CAPS
100.0000 mg | ORAL_CAPSULE | Freq: Two times a day (BID) | ORAL | Status: DC
  Administered 2015-01-31 – 2015-02-01 (×3): 100 mg via ORAL
  Filled 2015-01-31 (×3): qty 1

## 2015-01-31 MED ORDER — ONDANSETRON HCL 4 MG PO TABS: 4.0000 mg | ORAL_TABLET | ORAL | Status: DC | PRN

## 2015-01-31 MED ORDER — OXYCODONE-ACETAMINOPHEN 5-325 MG PO TABS: 1.0000 | ORAL_TABLET | ORAL | Status: DC | PRN

## 2015-01-31 MED ORDER — FERROUS SULFATE 325 (65 FE) MG PO TABS
325.0000 mg | ORAL_TABLET | Freq: Two times a day (BID) | ORAL | Status: DC
  Administered 2015-01-31 – 2015-02-01 (×3): 325 mg via ORAL
  Filled 2015-01-31 (×3): qty 1

## 2015-01-31 MED ORDER — LANOLIN HYDROUS EX OINT: TOPICAL_OINTMENT | CUTANEOUS | Status: DC | PRN

## 2015-01-31 NOTE — Progress Notes (Signed)
Post Partum Day 1 Subjective: no complaints, up ad lib, voiding, tolerating PO and + flatus  Objective: Blood pressure 114/69, pulse 72, temperature 98.4 F (36.9 C), temperature source Oral, resp. rate 20, height 5\' 5"  (1.651 m), weight 185 lb (83.915 kg), last menstrual period 04/29/2014, SpO2 100 %, unknown if currently breastfeeding.  Physical Exam:  General: alert and cooperative Lochia: appropriate Uterine Fundus: firm Incision: NA DVT Evaluation: No evidence of DVT seen on physical exam.   Recent Labs  01/30/15 0106 01/31/15 0555  HGB 12.2 11.4*  HCT 35.3 33.7*    Assessment/Plan: Plan for discharge tomorrow and Breastfeeding   LOS: 1 day   Dawn Morris 01/31/2015, 4:00 PM

## 2015-01-31 NOTE — Discharge Summary (Signed)
Obstetric Discharge Summary Reason for Admission: induction of labor and Gestational HTN Prenatal Procedures: NST and ultrasound Intrapartum Procedures: spontaneous vaginal delivery Postpartum Procedures: none Complications-Operative and Postpartum: none HEMOGLOBIN  Date Value Ref Range Status  01/31/2015 11.4* 12.0 - 16.0 g/dL Final   HGB  Date Value Ref Range Status  10/11/2011 10.1* 12.0-16.0 g/dL Final   HCT  Date Value Ref Range Status  01/31/2015 33.7* 35.0 - 47.0 % Final  10/11/2011 30.4* 35.0-47.0 % Final   HEMATOCRIT  Date Value Ref Range Status  11/13/2014 34.4 34.0 - 46.6 % Final    Physical Exam:  BP 114/69 mmHg  Pulse 72  Temp(Src) 98.4 F (36.9 C) (Oral)  Resp 20  Ht 5\' 5"  (1.651 m)  Wt 185 lb (83.915 kg)  BMI 30.79 kg/m2  SpO2 100%  LMP 04/29/2014  Breastfeeding? Unknown  General: alert and cooperative Lochia: appropriate Uterine Fundus: firm Incision: NA DVT Evaluation: No evidence of DVT seen on physical exam.  Discharge Diagnoses: Term Pregnancy-delivered; Gestational HTN, resolved  Discharge Information: Date: 02/01/2015 Activity: pelvic rest Diet: routine Medications: PNV, Ibuprofen and Percocet Condition: stable Instructions: refer to practice specific booklet Discharge to: home   Newborn Data: Live born female  Birth Weight: 8 lb 0.4 oz (3640 g) APGAR: 7, 9  Home with mother  Breastfeeding.  Dawn Maid, MD [facility] 02/01/2015 2:00 PM

## 2015-02-01 MED ORDER — IBUPROFEN 800 MG PO TABS: 800.0000 mg | ORAL_TABLET | Freq: Three times a day (TID) | ORAL | Status: DC

## 2015-02-01 MED ORDER — DOCUSATE SODIUM 100 MG PO CAPS: 100.0000 mg | ORAL_CAPSULE | Freq: Two times a day (BID) | ORAL | Status: DC | PRN

## 2015-02-01 NOTE — Discharge Instructions (Signed)

## 2015-02-01 NOTE — Progress Notes (Signed)
Post Partum Day 2  Subjective: no complaints, up ad lib, voiding, tolerating PO and + flatus  Objective: Blood pressure 103/67, pulse 62, temperature 98.3 F (36.8 C), temperature source Oral, resp. rate 18, height 5\' 5"  (1.651 m), weight 185 lb (83.915 kg), last menstrual period 04/29/2014, SpO2 100 %, unknown if currently breastfeeding.  Physical Exam:  General: alert and cooperative Lochia: appropriate Uterine Fundus: firm Incision: NA DVT Evaluation: No evidence of DVT seen on physical exam.   Recent Labs  01/30/15 0106 01/31/15 0555  HGB 12.2 11.4*  HCT 35.3 33.7*    Assessment/Plan: Discharge home, Breastfeeding and Contraception undecided   LOS: 2 days   Dawn Morris 02/01/2015, 1:51 PM

## 2015-02-01 NOTE — Progress Notes (Signed)
Patient discharged home with infant. Discharge instructions, prescriptions and follow up appointment given to and reviewed with patient. Patient verbalized understanding. Escorted out with infant by Perley Jain, RN.

## 2015-03-12 ENCOUNTER — Encounter: Payer: Self-pay | Admitting: Obstetrics and Gynecology

## 2015-03-12 ENCOUNTER — Ambulatory Visit (INDEPENDENT_AMBULATORY_CARE_PROVIDER_SITE_OTHER): Payer: BLUE CROSS/BLUE SHIELD | Admitting: Obstetrics and Gynecology

## 2015-03-12 MED ORDER — NORETHINDRONE 0.35 MG PO TABS: 1.0000 | ORAL_TABLET | Freq: Every day | ORAL | Status: DC

## 2015-03-12 NOTE — Patient Instructions (Signed)
1.  Start on the mini pill for contraception while breast-feeding. 2.  Resume all activities without restriction. 3.  Literature on Mirena IUD is given. 4.  Return in 6 months for annual exam.

## 2015-03-12 NOTE — Progress Notes (Signed)
Patient ID: Dawn Morris, female   DOB: 09/09/1990, 24 y.o.   MRN: [id number] 6 week pp svd- 01/30/15 Female- 8.0lb  Pumping - no issues with breast Thinking pill for b/c Subjective:     Dawn Morris is a 24 y.o. female who presents for a postpartum visit. She is 6 weeks postpartum following a spontaneous vaginal delivery. I have fully reviewed the prenatal and intrapartum course. The delivery was at 40 gestational weeks. Outcome: spontaneous vaginal delivery. Anesthesia: epidural. Postpartum course has been Uneventful. Baby's course has been Uneventful. Baby is feeding by both breast and bottle - .. Bleeding no bleeding. Bowel function is normal. Bladder function is normal. Patient is not sexually active. Contraception method is oral progesterone-only contraceptive. Postpartum depression screening: negative.  The following portions of the patient's history were reviewed and updated as appropriate: allergies, current medications, past family history, past medical history, past social history, past surgical history and problem list.  Review of Systems Pertinent items are noted in HPI.   Objective:    BP 111/76 mmHg  Pulse 76  Ht 5\' 5"  (1.651 m)  Wt 168 lb 12.8 oz (76.567 kg)  BMI 28.09 kg/m2  Breastfeeding? Yes  General:  alert and cooperative   Breasts:  inspection negative, no nipple discharge or bleeding, no masses or nodularity palpable  Lungs: normal  Heart:  regular rate and rhythm, S1, S2 normal, no murmur, click, rub or gallop and regular rate and rhythm  Abdomen: soft, non-tender; bowel sounds normal; no masses,  no organomegaly   Vulva:  normal  Vagina: normal vagina  Cervix:  no lesions  Corpus: normal size, contour, position, consistency, mobility, non-tender  Adnexa:  normal adnexa  Rectal Exam: Not performed.        Assessment:     6 week postpartum exam Nursing  Plan:    1. Contraception: oral progesterone-only contraceptive 2. Resume all  activities without restriction. 3.  Mirena IUD.  Literature given. 4.Return in 6 months for annual exam or as needed.  If patient desires to start Mirena IUD for contraception  Brayton Mars, MD  Note: This dictation was prepared with Dragon dictation along with smaller phrase technology. Any transcriptional errors that result from this process are unintentional.

## 2015-08-12 ENCOUNTER — Ambulatory Visit (INDEPENDENT_AMBULATORY_CARE_PROVIDER_SITE_OTHER): Payer: BLUE CROSS/BLUE SHIELD | Admitting: Obstetrics and Gynecology

## 2015-08-12 ENCOUNTER — Encounter: Payer: Self-pay | Admitting: Obstetrics and Gynecology

## 2015-08-12 VITALS — BP 117/80 | HR 97 | Ht 65.0 in | Wt 175.5 lb

## 2015-08-12 DIAGNOSIS — Z30011 Encounter for initial prescription of contraceptive pills: Secondary | ICD-10-CM | POA: Diagnosis not present

## 2015-08-12 DIAGNOSIS — Z01419 Encounter for gynecological examination (general) (routine) without abnormal findings: Secondary | ICD-10-CM | POA: Diagnosis not present

## 2015-08-12 MED ORDER — NORGESTIMATE-ETH ESTRADIOL 0.25-35 MG-MCG PO TABS: 1.0000 | ORAL_TABLET | Freq: Every day | ORAL | Status: DC

## 2015-08-12 NOTE — Patient Instructions (Signed)
1. Pap smear is done. 2. Monthly self breast exam is encouraged 3. Healthy eating and exercise is to be continued 4. Combined oral contraceptive is prescribed 5. Return in 1 year for physical

## 2015-08-12 NOTE — Progress Notes (Signed)
Patient ID: Dawn Morris, female   DOB: 1990-11-17, 25 y.o.   MRN: [id number] ANNUAL PREVENTATIVE CARE GYN  ENCOUNTER NOTE  Subjective:       Dawn Morris is a 25 y.o. G50P1001 female here for a routine annual gynecologic exam.  Current complaints: 1.  D/c breastfeeding- 3 months ago - needs to change ocp   Gynecologic History Patient's last menstrual period was 07/22/2015 (exact date). Contraception: oral progesterone-only contraceptive Last Pap: 2016. Results were: normal Last mammogram: n./a Results were: n/a  Obstetric History OB History  Gravida Para Term Preterm AB SAB TAB Ectopic Multiple Living  1 1 1       0 1    # Outcome Date GA Lbr Len/2nd Weight Sex Delivery Anes PTL Lv  1 Term 01/30/15 [redacted]w[redacted]d 11:52 / 01:14 8 lb 0.4 oz (3.64 kg) F Vag-Spont EPI  Y     Comments: none noted      Past Medical History  Diagnosis Date  . Increased BMI   . Low-lying placenta   . Echogenic intracardiac focus of fetus on prenatal ultrasound     09/10/2014    Past Surgical History  Procedure Laterality Date  . Tonsillectomy  2005    Current Outpatient Prescriptions on File Prior to Visit  Medication Sig Dispense Refill  . Prenatal Vit-Fe Fumarate-FA (MULTIVITAMIN-PRENATAL) 27-0.8 MG TABS tablet Take 1 tablet by mouth daily at 12 noon.     No current facility-administered medications on file prior to visit.    No Known Allergies  Social History   Social History  . Marital Status: Single    Spouse Name: N/A  . Number of Children: N/A  . Years of Education: N/A   Occupational History  . Not on file.   Social History Main Topics  . Smoking status: Never Smoker   . Smokeless tobacco: Never Used  . Alcohol Use: No  . Drug Use: No  . Sexual Activity: Yes    Birth Control/ Protection: Pill   Other Topics Concern  . Not on file   Social History Narrative    Family History  Problem Relation Age of Onset  . Cancer Neg Hx   . Diabetes Neg Hx   . Heart  disease Neg Hx     The following portions of the patient's history were reviewed and updated as appropriate: allergies, current medications, past family history, past medical history, past social history, past surgical history and problem list.  Review of Systems ROS Review of Systems - General ROS: negative for - chills, fatigue, fever, hot flashes, night sweats, weight gain or weight loss Psychological ROS: negative for - anxiety, decreased libido, depression, mood swings, physical abuse or sexual abuse Ophthalmic ROS: negative for - blurry vision, eye pain or loss of vision ENT ROS: negative for - headaches, hearing change, visual changes or vocal changes Allergy and Immunology ROS: negative for - hives, itchy/watery eyes or seasonal allergies Hematological and Lymphatic ROS: negative for - bleeding problems, bruising, swollen lymph nodes or weight loss Endocrine ROS: negative for - galactorrhea, hair pattern changes, hot flashes, malaise/lethargy, mood swings, palpitations, polydipsia/polyuria, skin changes, temperature intolerance or unexpected weight changes Breast ROS: negative for - new or changing breast lumps or nipple discharge Respiratory ROS: negative for - cough or shortness of breath Cardiovascular ROS: negative for - chest pain, irregular heartbeat, palpitations or shortness of breath Gastrointestinal ROS: no abdominal pain, change in bowel habits, or black or bloody stools Genito-Urinary ROS: no dysuria, trouble  voiding, or hematuria Musculoskeletal ROS: negative for - joint pain or joint stiffness Neurological ROS: negative for - bowel and bladder control changes Dermatological ROS: negative for rash and skin lesion changes   Objective:   BP 117/80 mmHg  Pulse 97  Ht 5\' 5"  (1.651 m)  Wt 175 lb 8 oz (79.606 kg)  BMI 29.20 kg/m2  LMP 07/22/2015 (Exact Date)  Breastfeeding? No CONSTITUTIONAL: Well-developed, well-nourished female in no acute distress.  PSYCHIATRIC:  Normal mood and affect. Normal behavior. Normal judgment and thought content. [place]: Alert and oriented to person, place, and time. Normal muscle tone coordination. No cranial nerve deficit noted. HENT:  Normocephalic, atraumatic, External right and left ear normal. Oropharynx is clear and moist EYES: Conjunctivae and EOM are normal. Pupils are equal, round, and reactive to light. No scleral icterus.  NECK: Normal range of motion, supple, no masses.  Normal thyroid.  SKIN: Skin is warm and dry. No rash noted. Not diaphoretic. No erythema. No pallor. CARDIOVASCULAR: Normal heart rate noted, regular rhythm, no murmur. RESPIRATORY: Clear to auscultation bilaterally. Effort and breath sounds normal, no problems with respiration noted. BREASTS: Symmetric in size. No masses, skin changes, nipple drainage, or lymphadenopathy. ABDOMEN: Soft, normal bowel sounds, no distention noted.  No tenderness, rebound or guarding.  BLADDER: Normal PELVIC:  External Genitalia: Normal  BUS: Normal  Vagina: Normal  Cervix: Normal  Uterus: Normal; midplane, normal size and shape, mobile  Adnexa: Normal  RV: External Exam NormaI  MUSCULOSKELETAL: Normal range of motion. No tenderness.  No cyanosis, clubbing, or edema.  2+ distal pulses. LYMPHATIC: No Axillary, Supraclavicular, or Inguinal Adenopathy.    Assessment:   Annual gynecologic examination 25 y.o. Contraception: oral progesterone-only contraceptive   Plan:  Pap: pap w rflx ct/ng Mammogram: Not Indicated Stool Guaiac Testing:  Not Indicated Labs: vit d fbs a1c lipid tsh Routine preventative health maintenance measures emphasized: Exercise/Diet/Weight control, Tobacco Warnings and Alcohol/Substance use risks Combined oral contraceptive is prescribed Return to [place], CMA  Brayton Mars, MD  Note: This dictation was prepared with Dragon dictation along with smaller phrase technology. Any transcriptional  errors that result from this process are unintentional.

## 2015-08-18 LAB — PAP IG, CT-NG, RFX HPV ASCU
Chlamydia, Nuc. Acid Amp: NEGATIVE
GONOCOCCUS BY NUCLEIC ACID AMP: NEGATIVE
PAP SMEAR COMMENT: 0

## 2015-08-20 ENCOUNTER — Telehealth: Payer: Self-pay | Admitting: Obstetrics and Gynecology

## 2015-08-20 NOTE — Telephone Encounter (Signed)
-----   Message from Brayton Mars, MD sent at 08/18/2015  2:58 PM EDT ----- Please Notify - Labs normal

## 2015-08-20 NOTE — Telephone Encounter (Signed)
Beulah called you back!

## 2015-08-21 NOTE — Telephone Encounter (Signed)
Letter mailed to pt.  

## 2016-08-15 NOTE — Progress Notes (Deleted)
Patient ID: Dawn Morris, female   DOB: 1990/07/29, 26 y.o.   MRN: [id number] ANNUAL PREVENTATIVE CARE GYN  ENCOUNTER NOTE  Subjective:       Dawn Morris is a 26 y.o. G33P1001 female here for a routine annual gynecologic exam.  Current complaints: 1.    Gynecologic History No LMP recorded. Contraception: sprintec Last Pap: 07/2015 neg/neg/neg. Results were: normal Last mammogram: n./a Results were: n/a  Obstetric History OB History  Gravida Para Term Preterm AB Living  1 1 1     1   SAB TAB Ectopic Multiple Live Births        0 1    # Outcome Date GA Lbr Len/2nd Weight Sex Delivery Anes PTL Lv  1 Term 01/30/15 [redacted]w[redacted]d 11:52 / 01:14 8 lb 0.4 oz (3.64 kg) F Vag-Spont EPI  LIV     Birth Comments: none noted      Past Medical History:  Diagnosis Date  . Echogenic intracardiac focus of fetus on prenatal ultrasound    09/10/2014  . Increased BMI   . Low-lying placenta     Past Surgical History:  Procedure Laterality Date  . TONSILLECTOMY  2005    Current Outpatient Prescriptions on File Prior to Visit  Medication Sig Dispense Refill  . norgestimate-ethinyl estradiol (ORTHO-CYCLEN,SPRINTEC,PREVIFEM) 0.25-35 MG-MCG tablet Take 1 tablet by mouth daily. 1 Package 11  . Prenatal Vit-Fe Fumarate-FA (MULTIVITAMIN-PRENATAL) 27-0.8 MG TABS tablet Take 1 tablet by mouth daily at 12 noon.     No current facility-administered medications on file prior to visit.     No Known Allergies  Social History   Social History  . Marital status: Single    Spouse name: N/A  . Number of children: N/A  . Years of education: N/A   Occupational History  . Not on file.   Social History Main Topics  . Smoking status: Never Smoker  . Smokeless tobacco: Never Used  . Alcohol use No  . Drug use: No  . Sexual activity: Yes    Birth control/ protection: Pill   Other Topics Concern  . Not on file   Social History Narrative  . No narrative on file    Family History  Problem  Relation Age of Onset  . Cancer Neg Hx   . Diabetes Neg Hx   . Heart disease Neg Hx     The following portions of the patient's history were reviewed and updated as appropriate: allergies, current medications, past family history, past medical history, past social history, past surgical history and problem list.  Review of Systems ROS   Objective:   There were no vitals taken for this visit. CONSTITUTIONAL: Well-developed, well-nourished female in no acute distress.  PSYCHIATRIC: Normal mood and affect. Normal behavior. Normal judgment and thought content. [place]: Alert and oriented to person, place, and time. Normal muscle tone coordination. No cranial nerve deficit noted. HENT:  Normocephalic, atraumatic, External right and left ear normal. Oropharynx is clear and moist EYES: Conjunctivae and EOM are normal. Pupils are equal, round, and reactive to light. No scleral icterus.  NECK: Normal range of motion, supple, no masses.  Normal thyroid.  SKIN: Skin is warm and dry. No rash noted. Not diaphoretic. No erythema. No pallor. CARDIOVASCULAR: Normal heart rate noted, regular rhythm, no murmur. RESPIRATORY: Clear to auscultation bilaterally. Effort and breath sounds normal, no problems with respiration noted. BREASTS: Symmetric in size. No masses, skin changes, nipple drainage, or lymphadenopathy. ABDOMEN: Soft, normal bowel sounds, no distention  noted.  No tenderness, rebound or guarding.  BLADDER: Normal PELVIC:  External Genitalia: Normal  BUS: Normal  Vagina: Normal  Cervix: Normal  Uterus: Normal; midplane, normal size and shape, mobile  Adnexa: Normal  RV: External Exam NormaI  MUSCULOSKELETAL: Normal range of motion. No tenderness.  No cyanosis, clubbing, or edema.  2+ distal pulses. LYMPHATIC: No Axillary, Supraclavicular, or Inguinal Adenopathy.    Assessment:   Annual gynecologic examination 26 y.o. Contraception:sprintec   Plan:  Pap: pap w rflx  ct/ng Mammogram: Not Indicated Stool Guaiac Testing:  Not Indicated Labs: vit d fbs a1c lipid tsh Routine preventative health maintenance measures emphasized: Exercise/Diet/Weight control, Tobacco Warnings and Alcohol/Substance use risks Combined oral contraceptive is prescribed Return to [place], [place]   Note: This dictation was prepared with Dragon dictation along with smaller phrase technology. Any transcriptional errors that result from this process are unintentional.

## 2016-08-17 ENCOUNTER — Encounter: Payer: BLUE CROSS/BLUE SHIELD | Admitting: Obstetrics and Gynecology

## 2016-09-01 NOTE — Progress Notes (Signed)
Patient ID: Dawn Morris, female   DOB: 10/24/1990, 26 y.o.   MRN: [id number] ANNUAL PREVENTATIVE CARE GYN  ENCOUNTER NOTE  Subjective:       Dawn Morris is a 26 y.o. G34P1001 female here for a routine annual gynecologic exam.  Current complaints: 1. None  Menstrual cycles are regular. No dysmenorrhea. 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 No major interval health issues. Bowel function is normal Bladder function is normal..   Gynecologic History No LMP recorded. Contraception: sprintec Last Pap: 07/2015 neg/neg/neg. Results were: normal Last mammogram: n./a Results were: n/a  Obstetric History OB History  Gravida Para Term Preterm AB Living  1 1 1     1   SAB TAB Ectopic Multiple Live Births        0 1    # Outcome Date GA Lbr Len/2nd Weight Sex Delivery Anes PTL Lv  1 Term 01/30/15 [redacted]w[redacted]d 11:52 / 01:14 8 lb 0.4 oz (3.64 kg) F Vag-Spont EPI  LIV     Birth Comments: none noted      Past Medical History:  Diagnosis Date  . Echogenic intracardiac focus of fetus on prenatal ultrasound    09/10/2014  . Increased BMI   . Low-lying placenta     Past Surgical History:  Procedure Laterality Date  . TONSILLECTOMY  2005    Current Outpatient Prescriptions on File Prior to Visit  Medication Sig Dispense Refill  . norgestimate-ethinyl estradiol (ORTHO-CYCLEN,SPRINTEC,PREVIFEM) 0.25-35 MG-MCG tablet Take 1 tablet by mouth daily. 1 Package 11  . Prenatal Vit-Fe Fumarate-FA (MULTIVITAMIN-PRENATAL) 27-0.8 MG TABS tablet Take 1 tablet by mouth daily at 12 noon.     No current facility-administered medications on file prior to visit.     No Known Allergies  Social History   Social History  . Marital status: Single    Spouse name: N/A  . Number of children: N/A  . Years of education: N/A   Occupational History  . Not on file.   Social History Main Topics  . Smoking status: Never Smoker  . Smokeless tobacco: Never Used  . Alcohol use No  . Drug  use: No  . Sexual activity: Yes    Birth control/ protection: Pill   Other Topics Concern  . Not on file   Social History Narrative  . No narrative on file    Family History  Problem Relation Age of Onset  . Cancer Neg Hx   . Diabetes Neg Hx   . Heart disease Neg Hx     The following portions of the patient's history were reviewed and updated as appropriate: allergies, current medications, past family history, past medical history, past social history, past surgical history and problem list.  Review of Systems Review of Systems  Constitutional: Negative.   HENT: Negative.   Eyes: Negative.   Respiratory: Negative.   Cardiovascular: Negative.   Gastrointestinal: Negative.   Genitourinary: Negative.   Musculoskeletal: Negative.   Skin: Negative.   Neurological: Negative.   Endo/Heme/Allergies: Negative.   Psychiatric/Behavioral: Negative.      Objective:   BP (!) 105/58   Pulse 86   Ht 5\' 5"  (1.651 m)   Wt 166 lb 12.8 oz (75.7 kg)   LMP 08/12/2015 (Approximate)   Breastfeeding? No   BMI 27.76 kg/m   CONSTITUTIONAL: Well-developed, well-nourished female in no acute distress.  PSYCHIATRIC: Normal mood and affect. Normal behavior. Normal judgment and thought content. [place]: Alert and oriented to person, place, and time. Normal muscle  tone coordination. No cranial nerve deficit noted. HENT:  Normocephalic, atraumatic, External right and left ear normal.  EYES: Conjunctivae and EOM are normal. . No scleral icterus.  NECK: Normal range of motion, supple, no masses.  Normal thyroid.  SKIN: Skin is warm and dry. No rash noted. Not diaphoretic. No erythema. No pallor. CARDIOVASCULAR: Normal heart rate noted, regular rhythm, no murmur. RESPIRATORY: Clear to auscultation bilaterally. Effort and breath sounds normal, no problems with respiration noted. BREASTS: Symmetric in size. No masses, skin changes, nipple drainage, or lymphadenopathy. ABDOMEN: Soft, normal bowel  sounds, no distention noted.  No tenderness, rebound or guarding.  BLADDER: Normal PELVIC:  External Genitalia: Normal  BUS: Normal  Vagina: Normal; moderate white discharge  Cervix: Normal; no lesions; no cervical motion tenderness  Uterus: Normal; midplane, normal size and shape, mobile, nontender  Adnexa: Normal; nonpalpable and nontender  RV: External Exam NormaI  MUSCULOSKELETAL: Normal range of motion. No tenderness.  No cyanosis, clubbing, or edema.  2+ distal pulses. LYMPHATIC: No Axillary, Supraclavicular, or Inguinal Adenopathy.    Assessment:   Annual gynecologic examination 26 y.o. Contraception:sprintec   Plan:  Pap: pap w rflx ct/ng Mammogram: Not Indicated Stool Guaiac Testing:  Not Indicated Labs: vit d fbs a1c lipid tsh to be obtained within the next 1-2 weeks Routine preventative health maintenance measures emphasized: Exercise/Diet/Weight control, Tobacco Warnings and Alcohol/Substance use risks Combined oral contraceptive is prescribed Return to [place], [facility]  Brayton Mars, MD   Note: This dictation was prepared with Dragon dictation along with smaller phrase technology. Any transcriptional errors that result from this process are unintentional.

## 2016-09-05 ENCOUNTER — Encounter: Payer: Self-pay | Admitting: Obstetrics and Gynecology

## 2016-09-05 ENCOUNTER — Ambulatory Visit (INDEPENDENT_AMBULATORY_CARE_PROVIDER_SITE_OTHER): Payer: BLUE CROSS/BLUE SHIELD | Admitting: Obstetrics and Gynecology

## 2016-09-05 VITALS — BP 105/58 | HR 86 | Ht 65.0 in | Wt 166.8 lb

## 2016-09-05 DIAGNOSIS — Z3041 Encounter for surveillance of contraceptive pills: Secondary | ICD-10-CM

## 2016-09-05 DIAGNOSIS — Z01419 Encounter for gynecological examination (general) (routine) without abnormal findings: Secondary | ICD-10-CM | POA: Diagnosis not present

## 2016-09-05 MED ORDER — NORGESTIMATE-ETH ESTRADIOL 0.25-35 MG-MCG PO TABS: 1.0000 | ORAL_TABLET | Freq: Every day | ORAL | 4 refills | Status: DC

## 2016-09-05 NOTE — Patient Instructions (Signed)
1. Pap smear is done 2. Self breast awareness is encouraged 3. Continue with healthy eating and exercise 4. Contraception-Sprintec oral contraceptive is refilled 5. Return for screening labs in 1-2 weeks 6. Return in 1 year for annual exam  Health Maintenance, Female Adopting a healthy lifestyle and getting preventive care can go a long way to promote health and wellness. Talk with your health care provider about what schedule of regular examinations is right for you. This is a good chance for you to check in with your provider about disease prevention and staying healthy. In between checkups, there are plenty of things you can do on your own. Experts have done a lot of research about which lifestyle changes and preventive measures are most likely to keep you healthy. Ask your health care provider for more information. Weight and diet Eat a healthy diet  Be sure to include plenty of vegetables, fruits, low-fat dairy products, and lean protein.  Do not eat a lot of foods high in solid fats, added sugars, or salt.  Get regular exercise. This is one of the most important things you can do for your health.  Most adults should exercise for at least 150 minutes each week. The exercise should increase your heart rate and make you sweat (moderate-intensity exercise).  Most adults should also do strengthening exercises at least twice a week. This is in addition to the moderate-intensity exercise. Maintain a healthy weight  Body mass index (BMI) is a measurement that can be used to identify possible weight problems. It estimates body fat based on height and weight. Your health care provider can help determine your BMI and help you achieve or maintain a healthy weight.  For females 19 years of age and older:  A BMI below 18.5 is considered underweight.  A BMI of 18.5 to 24.9 is normal.  A BMI of 25 to 29.9 is considered overweight.  A BMI of 30 and above is considered obese. Watch levels of  cholesterol and blood lipids  You should start having your blood tested for lipids and cholesterol at 27 years of age, then have this test every 5 years.  You may need to have your cholesterol levels checked more often if:  Your lipid or cholesterol levels are high.  You are older than 27 years of age.  You are at high risk for heart disease. Cancer screening Lung Cancer  Lung cancer screening is recommended for adults 57-15 years old who are at high risk for lung cancer because of a history of smoking.  A yearly low-dose CT scan of the lungs is recommended for people who:  Currently smoke.  Have quit within the past 15 years.  Have at least a 30-pack-year history of smoking. A pack year is smoking an average of one pack of cigarettes a day for 1 year.  Yearly screening should continue until it has been 15 years since you quit.  Yearly screening should stop if you develop a health problem that would prevent you from having lung cancer treatment. Breast Cancer  Practice breast self-awareness. This means understanding how your breasts normally appear and feel.  It also means doing regular breast self-exams. Let your health care provider know about any changes, no matter how small.  If you are in your 20s or 30s, you should have a clinical breast exam (CBE) by a health care provider every 1-3 years as part of a regular health exam.  If you are 51 or older, have a CBE  every year. Also consider having a breast X-ray (mammogram) every year.  If you have a family history of breast cancer, talk to your health care provider about genetic screening.  If you are at high risk for breast cancer, talk to your health care provider about having an MRI and a mammogram every year.  Breast cancer gene (BRCA) assessment is recommended for women who have family members with BRCA-related cancers. BRCA-related cancers include:  Breast.  Ovarian.  Tubal.  Peritoneal cancers.  Results of  the assessment will determine the need for genetic counseling and BRCA1 and BRCA2 testing. Cervical Cancer  Your health care provider may recommend that you be screened regularly for cancer of the pelvic organs (ovaries, uterus, and vagina). This screening involves a pelvic examination, including checking for microscopic changes to the surface of your cervix (Pap test). You may be encouraged to have this screening done every 3 years, beginning at age 78.  For women ages 5-65, health care providers may recommend pelvic exams and Pap testing every 3 years, or they may recommend the Pap and pelvic exam, combined with testing for human papilloma virus (HPV), every 5 years. Some types of HPV increase your risk of cervical cancer. Testing for HPV may also be done on women of any age with unclear Pap test results.  Other health care providers may not recommend any screening for nonpregnant women who are considered low risk for pelvic cancer and who do not have symptoms. Ask your health care provider if a screening pelvic exam is right for you.  If you have had past treatment for cervical cancer or a condition that could lead to cancer, you need Pap tests and screening for cancer for at least 20 years after your treatment. If Pap tests have been discontinued, your risk factors (such as having a new sexual partner) need to be reassessed to determine if screening should resume. Some women have medical problems that increase the chance of getting cervical cancer. In these cases, your health care provider may recommend more frequent screening and Pap tests. Colorectal Cancer  This type of cancer can be detected and often prevented.  Routine colorectal cancer screening usually begins at 27 years of age and continues through 27 years of age.  Your health care provider may recommend screening at an earlier age if you have risk factors for colon cancer.  Your health care provider may also recommend using home test  kits to check for hidden blood in the stool.  A small camera at the end of a tube can be used to examine your colon directly (sigmoidoscopy or colonoscopy). This is done to check for the earliest forms of colorectal cancer.  Routine screening usually begins at age 77.  Direct examination of the colon should be repeated every 5-10 years through 27 years of age. However, you may need to be screened more often if early forms of precancerous polyps or small growths are found. Skin Cancer  Check your skin from head to toe regularly.  Tell your health care provider about any new moles or changes in moles, especially if there is a change in a mole's shape or color.  Also tell your health care provider if you have a mole that is larger than the size of a pencil eraser.  Always use sunscreen. Apply sunscreen liberally and repeatedly throughout the day.  Protect yourself by wearing long sleeves, pants, a wide-brimmed hat, and sunglasses whenever you are outside. Heart disease, diabetes, and high blood  pressure  High blood pressure causes heart disease and increases the risk of stroke. High blood pressure is more likely to develop in:  People who have blood pressure in the high end of the normal range (130-139/85-89 mm Hg).  People who are overweight or obese.  People who are African American.  If you are 68-61 years of age, have your blood pressure checked every 3-5 years. If you are 96 years of age or older, have your blood pressure checked every year. You should have your blood pressure measured twice-once when you are at a hospital or clinic, and once when you are not at a hospital or clinic. Record the average of the two measurements. To check your blood pressure when you are not at a hospital or clinic, you can use:  An automated blood pressure machine at a pharmacy.  A home blood pressure monitor.  If you are between 43 years and 85 years old, ask your health care provider if you should  take aspirin to prevent strokes.  Have regular diabetes screenings. This involves taking a blood sample to check your fasting blood sugar level.  If you are at a normal weight and have a low risk for diabetes, have this test once every three years after 27 years of age.  If you are overweight and have a high risk for diabetes, consider being tested at a younger age or more often. Preventing infection Hepatitis B  If you have a higher risk for hepatitis B, you should be screened for this virus. You are considered at high risk for hepatitis B if:  You were born in a country where hepatitis B is common. Ask your health care provider which countries are considered high risk.  Your parents were born in a high-risk country, and you have not been immunized against hepatitis B (hepatitis B vaccine).  You have HIV or AIDS.  You use needles to inject street drugs.  You live with someone who has hepatitis B.  You have had sex with someone who has hepatitis B.  You get hemodialysis treatment.  You take certain medicines for conditions, including cancer, organ transplantation, and autoimmune conditions. Hepatitis C  Blood testing is recommended for:  Everyone born from [birth year] through 1966.  Anyone with known risk factors for hepatitis C. Sexually transmitted infections (STIs)  You should be screened for sexually transmitted infections (STIs) including gonorrhea and chlamydia if:  You are sexually active and are younger than 27 years of age.  You are older than 28 years of age and your health care provider tells you that you are at risk for this type of infection.  Your sexual activity has changed since you were last screened and you are at an increased risk for chlamydia or gonorrhea. Ask your health care provider if you are at risk.  If you do not have HIV, but are at risk, it may be recommended that you take a prescription medicine daily to prevent HIV infection. This is called  pre-exposure prophylaxis (PrEP). You are considered at risk if:  You are sexually active and do not regularly use condoms or know the HIV status of your partner(s).  You take drugs by injection.  You are sexually active with a partner who has HIV. Talk with your health care provider about whether you are at high risk of being infected with HIV. If you choose to begin PrEP, you should first be tested for HIV. You should then be tested every 3 months for  as long as you are taking PrEP. Pregnancy  If you are premenopausal and you may become pregnant, ask your health care provider about preconception counseling.  If you may become pregnant, take 400 to 800 micrograms (mcg) of folic acid every day.  If you want to prevent pregnancy, talk to your health care provider about birth control (contraception). Osteoporosis and menopause  Osteoporosis is a disease in which the bones lose minerals and strength with aging. This can result in serious bone fractures. Your risk for osteoporosis can be identified using a bone density scan.  If you are 67 years of age or older, or if you are at risk for osteoporosis and fractures, ask your health care provider if you should be screened.  Ask your health care provider whether you should take a calcium or vitamin D supplement to lower your risk for osteoporosis.  Menopause may have certain physical symptoms and risks.  Hormone replacement therapy may reduce some of these symptoms and risks. Talk to your health care provider about whether hormone replacement therapy is right for you. Follow these instructions at home:  Schedule regular health, dental, and eye exams.  Stay current with your immunizations.  Do not use any tobacco products including cigarettes, chewing tobacco, or electronic cigarettes.  If you are pregnant, do not drink alcohol.  If you are breastfeeding, limit how much and how often you drink alcohol.  Limit alcohol intake to no more  than 1 drink per day for nonpregnant women. One drink equals 12 ounces of beer, 5 ounces of wine, or 1 ounces of hard liquor.  Do not use street drugs.  Do not share needles.  Ask your health care provider for help if you need support or information about quitting drugs.  Tell your health care provider if you often feel depressed.  Tell your health care provider if you have ever been abused or do not feel safe at home. This information is not intended to replace advice given to you by your health care provider. Make sure you discuss any questions you have with your health care provider. Document Released: 11/15/2010 Document Revised: 10/08/2015 Document Reviewed: 02/03/2015 Elsevier Interactive Patient Education  2017 [employer].

## 2016-09-14 LAB — PAP IG W/ RFLX HPV ASCU: PAP SMEAR COMMENT: 0

## 2016-09-14 LAB — HPV DNA PROBE HIGH RISK, AMPLIFIED: HPV, HIGH-RISK: NEGATIVE

## 2016-11-22 ENCOUNTER — Ambulatory Visit (INDEPENDENT_AMBULATORY_CARE_PROVIDER_SITE_OTHER): Payer: BLUE CROSS/BLUE SHIELD | Admitting: Obstetrics and Gynecology

## 2016-11-22 ENCOUNTER — Encounter: Payer: Self-pay | Admitting: Obstetrics and Gynecology

## 2016-11-22 ENCOUNTER — Other Ambulatory Visit (INDEPENDENT_AMBULATORY_CARE_PROVIDER_SITE_OTHER): Payer: BLUE CROSS/BLUE SHIELD

## 2016-11-22 VITALS — BP 116/78 | HR 76 | Ht 65.0 in | Wt 167.2 lb

## 2016-11-22 DIAGNOSIS — O209 Hemorrhage in early pregnancy, unspecified: Secondary | ICD-10-CM

## 2016-11-22 DIAGNOSIS — R1032 Left lower quadrant pain: Secondary | ICD-10-CM | POA: Diagnosis not present

## 2016-11-22 DIAGNOSIS — O208 Other hemorrhage in early pregnancy: Secondary | ICD-10-CM

## 2016-11-22 DIAGNOSIS — N912 Amenorrhea, unspecified: Secondary | ICD-10-CM

## 2016-11-22 LAB — POCT URINE PREGNANCY: Preg Test, Ur: POSITIVE — AB

## 2016-11-22 NOTE — Patient Instructions (Addendum)
1. Pelvic ultrasound demonstrates viable 6.4 week intrauterine pregnancy 2. Pelvic ultrasound also demonstrates a left sided corpus luteum cyst. 3. No need for quantitative hCG titers 4. Return in 3 weeks for nursing OB intake 5. Return in 5 weeks for OB history and physical

## 2016-11-22 NOTE — Progress Notes (Signed)
GYN ENCOUNTER NOTE  Subjective:       Dawn Morris is a 26 y.o. G88P1001 female is here for gynecologic evaluation of the following issues:  1. Vaginal bleeding in early pregnancy 2. Left lower quadrant pain  No ectopic risk factors. Recent positive pregnancy test with onset of vaginal bleeding in the past 24 hours; this morning clots and mild cramping were noted. Bleeding was worse with standing position. No reported passage of tissue removed Patient is experiencing chronic left-sided throbbing, pins and needles type pain without obvious exacerbating or alleviating factors.   Gynecologic History Patient's last menstrual period was 10/07/2016 (exact date).  OB History  Gravida Para Term Preterm AB Living  1 1 1     1   SAB TAB Ectopic Multiple Live Births        0 1    # Outcome Date GA Lbr Len/2nd Weight Sex Delivery Anes PTL Lv  1 Term 01/30/15 [redacted]w[redacted]d 11:52 / 01:14 8 lb 0.4 oz (3.64 kg) F Vag-Spont EPI  LIV     Birth Comments: none noted      Past Medical History:  Diagnosis Date  . Echogenic intracardiac focus of fetus on prenatal ultrasound    09/10/2014  . Increased BMI   . Low-lying placenta     Past Surgical History:  Procedure Laterality Date  . TONSILLECTOMY  2005    Current Outpatient Prescriptions on File Prior to Visit  Medication Sig Dispense Refill  . Prenatal Vit-Fe Fumarate-FA (MULTIVITAMIN-PRENATAL) 27-0.8 MG TABS tablet Take 1 tablet by mouth daily at 12 noon.     No current facility-administered medications on file prior to visit.     No Known Allergies  Social History   Social History  . Marital status: Single    Spouse name: N/A  . Number of children: N/A  . Years of education: N/A   Occupational History  . Not on file.   Social History Main Topics  . Smoking status: Never Smoker  . Smokeless tobacco: Never Used  . Alcohol use No  . Drug use: No  . Sexual activity: Yes    Birth control/ protection: Pill   Other Topics  Concern  . Not on file   Social History Narrative  . No narrative on file    Family History  Problem Relation Age of Onset  . Cancer Neg Hx   . Diabetes Neg Hx   . Heart disease Neg Hx     The following portions of the patient's history were reviewed and updated as appropriate: allergies, current medications, past family history, past medical history, past social history, past surgical history and problem list.  Review of Systems Per history of present illness  Objective:   BP 116/78   Pulse 76   Ht 5\' 5"  (1.651 m)   Wt 167 lb 3.2 oz (75.8 kg)   LMP 10/07/2016 (Exact Date)   Breastfeeding? No   BMI 27.82 kg/m  CONSTITUTIONAL: Well-developed, well-nourished female in no acute distress.  HENT:  Normocephalic, atraumatic.  NECK: Normal range of motion, supple, no masses.  Normal thyroid.  SKIN: Skin is warm and dry. No rash noted. Not diaphoretic. No erythema. No pallor. [place]: Alert and oriented to person, place, and time. PSYCHIATRIC: Normal mood and affect. Normal behavior. Normal judgment and thought content. CARDIOVASCULAR:Not Examined RESPIRATORY: Not Examined BREASTS: Not Examined ABDOMEN: Soft, non distended; Non tender.  No Organomegaly.No peritoneal signs PELVIC:  External Genitalia: Normal  BUS: Normal  Vagina: Normal  Cervix: Normal; eversion present; cervix is closed; no evidence of vaginal discharge or bleeding; mild cervical motion tenderness with movement left right  Uterus: Normal size, shape,consistency, mobile, slightly tender  Adnexa: Normal right side without discomfort; left side with fullness and tenderness 2/4  RV: Normal external exam  Bladder: Nontender MUSCULOSKELETAL: Normal range of motion. No tenderness.  No cyanosis, clubbing, or edema.     Assessment:   1. Vaginal bleeding affecting early pregnancy - US OB Transvaginal; Future - US OB Comp Less 14 Wks; Future  2. Left lower quadrant pain; Left adnexal fullness on exam,  possible ovarian cyst versus ectopic   3. O+ blood type  Plan:    1. Pelvic ultrasound demonstrates viable 6.4 week intrauterine pregnancy 2. Pelvic ultrasound also demonstrates a left sided corpus luteum cyst. 3. No need for quantitative hCG titers 4. Return in 3 weeks for nursing OB intake 5. Return in 5 weeks for OB history and physical   A total of 15 minutes were spent face-to-face with the patient during this encounter and over half of that time dealt with counseling and coordination of care.  Brayton Mars, MD

## 2016-11-23 LAB — BETA HCG QUANT (REF LAB): HCG QUANT: 68636 m[IU]/mL

## 2016-11-24 ENCOUNTER — Encounter: Payer: Self-pay | Admitting: Obstetrics and Gynecology

## 2016-11-24 ENCOUNTER — Other Ambulatory Visit: Payer: Self-pay | Admitting: Obstetrics and Gynecology

## 2016-11-24 ENCOUNTER — Other Ambulatory Visit (INDEPENDENT_AMBULATORY_CARE_PROVIDER_SITE_OTHER): Payer: BLUE CROSS/BLUE SHIELD

## 2016-11-24 ENCOUNTER — Other Ambulatory Visit: Payer: Self-pay

## 2016-11-24 ENCOUNTER — Ambulatory Visit (INDEPENDENT_AMBULATORY_CARE_PROVIDER_SITE_OTHER): Payer: BLUE CROSS/BLUE SHIELD | Admitting: Obstetrics and Gynecology

## 2016-11-24 ENCOUNTER — Telehealth: Payer: Self-pay | Admitting: Obstetrics and Gynecology

## 2016-11-24 VITALS — BP 104/70 | HR 68 | Ht 65.0 in | Wt 166.3 lb

## 2016-11-24 DIAGNOSIS — O208 Other hemorrhage in early pregnancy: Secondary | ICD-10-CM

## 2016-11-24 DIAGNOSIS — O209 Hemorrhage in early pregnancy, unspecified: Secondary | ICD-10-CM

## 2016-11-24 DIAGNOSIS — O039 Complete or unspecified spontaneous abortion without complication: Secondary | ICD-10-CM | POA: Diagnosis not present

## 2016-11-24 NOTE — Patient Instructions (Signed)
1. Take ibuprofen 600 -800 mg 3 times a day for cramps 2. Quantitative hCG titer will be obtained today and in 1 week 3. Return in 1 week for follow-up 4.  work excuse given 1 week

## 2016-11-24 NOTE — Progress Notes (Signed)
Chief complaint: 1. Threatened abortion 2. Increased vaginal bleeding  Patient presents today for follow-up. Yesterday ultrasound confirmed intrauterine pregnancy with fetal cardiac activity. Patient noted sudden onset of heavy bleeding today with passage of possible tissue and mild cramps. No fever or chills or sweats.  Ultrasound today demonstrates a thickened endometrium only measuring 2.5 cm; no intrauterine gestational sac or fetal pole is visualized  Past medical history, past surgical history, problem list, medications, and allergies are reviewed  OBJECTIVE: BP 104/70   Pulse 68   Ht 5\' 5"  (1.651 m)   Wt 166 lb 4.8 oz (75.4 kg)   Breastfeeding? No   BMI 27.67 kg/m  Pleasant female in no acute distress. Alert and oriented Abdomen: Soft, nontender Pelvic exam: External genitalia-normal BUS-normal Vagina-minimal old blood in the vault; blood clot is at os; os is open to ring forceps; ring forceps insertion and extraction of minimal decidua tissue is obtained; serrated curette is used to gently curette the intrauterine cavity with production of minimal decidual tissue. Procedure was well-tolerated. Bimanual-deferred  ASSESSMENT: 1. Spontaneous abortion-complete  PLAN: 1. Ibuprofen 800 mg 3 times a day Tylenol when necessary 2. Quantitative hCG today and in 1 week 3. Return in 1 week for follow-up 4. Precautions are given for patient to return for any heavy bleeding worse than., Fever, or severe abdominal pelvic pain. 5. Intrauterine tissue is sent to pathology  Brayton Mars, MD  Note: This dictation was prepared with Dragon dictation along with smaller phrase technology. Any transcriptional errors that result from this process are unintentional.

## 2016-11-24 NOTE — Addendum Note (Signed)
Addended by: Elouise Munroe on: 11/24/2016 02:11 PM   Modules accepted: Orders

## 2016-11-24 NOTE — Telephone Encounter (Signed)
Early ob states this am she went to the bathroom and had about 200cc of blood in the toilet. She has changed 2 pads in the last hour. NO recent IC. Having lower abd cramping. Ibup helps. Appt today at 12:45 with mad.

## 2016-11-24 NOTE — Telephone Encounter (Signed)
Patient called with c/o bleeding - she is [redacted] weeks pregnant  Please call

## 2016-11-25 LAB — BETA HCG QUANT (REF LAB): HCG QUANT: 19346 m[IU]/mL

## 2016-11-28 LAB — PATHOLOGY

## 2016-11-29 ENCOUNTER — Encounter: Payer: Self-pay | Admitting: Obstetrics and Gynecology

## 2016-12-01 ENCOUNTER — Encounter: Payer: Self-pay | Admitting: Obstetrics and Gynecology

## 2016-12-01 ENCOUNTER — Ambulatory Visit (INDEPENDENT_AMBULATORY_CARE_PROVIDER_SITE_OTHER): Payer: BLUE CROSS/BLUE SHIELD | Admitting: Obstetrics and Gynecology

## 2016-12-01 VITALS — BP 112/73 | HR 73 | Ht 65.0 in | Wt 166.2 lb

## 2016-12-01 DIAGNOSIS — O039 Complete or unspecified spontaneous abortion without complication: Secondary | ICD-10-CM | POA: Diagnosis not present

## 2016-12-01 NOTE — Patient Instructions (Signed)
1. Obtain weekly quantitative hCG titer until the titer is down to 0 2. Start birth control pills after hCG titer drops 20 3. Maintain regular gynecologic appointments as scheduled with her next annual to in March/April 2019

## 2016-12-01 NOTE — Progress Notes (Signed)
Chief complaint: 1. . Abortion  Patient presents 1 week after being diagnosed with spontaneous abortion. Intrauterine contents were removed at last visit were consistent with decidua with chorionic villi. Quantitative hCGs have dropped from 68,000 down to 19,000; repeat quantitative hCG titer is due today. Patient is having mild vaginal bleeding without pelvic pain. No fever has been noted.  Past medical history, past surgical history, problem list, medications, and allergies are reviewed.  OBJECTIVE: BP 112/73   Pulse 73   Ht 5\' 5"  (1.651 m)   Wt 166 lb 3.2 oz (75.4 kg)   Breastfeeding? No   BMI 27.66 kg/m  Pleasant pregnant female in no acute distress Abdomen: Soft, nontender without organomegaly Pelvic exam: Speculum exam-minimal bloody mucus at os Bimanual-no cervical motion tenderness; uterus is midplane top of normal size and nontender; adnexa are nontender and nonenlarged  ASSESSMENT: 1. Status post spontaneous abortion with declining hCG titers 2. O+ blood type  PLAN: 1. Quantitative hCG titers weekly until less than 5 mL international units 2. Begin combined oral contraceptive once hCG titers are negative 3. Maintain annual gynecologic follow-up as scheduled  A total of 15 minutes were spent face-to-face with the patient during this encounter and over half of that time dealt with counseling and coordination of care.  Brayton Mars, MD  Note: This dictation was prepared with Dragon dictation along with smaller phrase technology. Any transcriptional errors that result from this process are unintentional.

## 2016-12-02 LAB — BETA HCG QUANT (REF LAB): hCG Quant: 1154 m[IU]/mL

## 2016-12-05 NOTE — Addendum Note (Signed)
Addended by: Elouise Munroe on: 12/05/2016 05:15 PM   Modules accepted: Orders

## 2016-12-08 ENCOUNTER — Other Ambulatory Visit: Payer: BLUE CROSS/BLUE SHIELD

## 2016-12-08 DIAGNOSIS — O039 Complete or unspecified spontaneous abortion without complication: Secondary | ICD-10-CM

## 2016-12-08 NOTE — Addendum Note (Signed)
Addended by: Elouise Munroe on: 12/08/2016 09:39 AM   Modules accepted: Orders

## 2016-12-09 LAB — BETA HCG QUANT (REF LAB): HCG QUANT: 133 m[IU]/mL

## 2016-12-13 ENCOUNTER — Encounter: Payer: Self-pay | Admitting: Obstetrics and Gynecology

## 2016-12-14 ENCOUNTER — Other Ambulatory Visit: Payer: BLUE CROSS/BLUE SHIELD

## 2016-12-14 ENCOUNTER — Telehealth: Payer: Self-pay

## 2016-12-14 ENCOUNTER — Other Ambulatory Visit: Payer: Self-pay

## 2016-12-14 DIAGNOSIS — O039 Complete or unspecified spontaneous abortion without complication: Secondary | ICD-10-CM

## 2016-12-14 NOTE — Telephone Encounter (Signed)
Error

## 2016-12-15 LAB — BETA HCG QUANT (REF LAB): hCG Quant: 40 m[IU]/mL

## 2016-12-21 ENCOUNTER — Encounter: Payer: Self-pay | Admitting: Obstetrics and Gynecology

## 2016-12-22 ENCOUNTER — Other Ambulatory Visit: Payer: Self-pay

## 2016-12-22 DIAGNOSIS — O039 Complete or unspecified spontaneous abortion without complication: Secondary | ICD-10-CM

## 2016-12-23 ENCOUNTER — Other Ambulatory Visit: Payer: Self-pay

## 2016-12-23 ENCOUNTER — Other Ambulatory Visit: Payer: BLUE CROSS/BLUE SHIELD

## 2016-12-23 DIAGNOSIS — O039 Complete or unspecified spontaneous abortion without complication: Secondary | ICD-10-CM

## 2016-12-25 LAB — BETA HCG QUANT (REF LAB): hCG Quant: 7 m[IU]/mL

## 2016-12-27 ENCOUNTER — Other Ambulatory Visit: Payer: Self-pay

## 2016-12-27 DIAGNOSIS — O039 Complete or unspecified spontaneous abortion without complication: Secondary | ICD-10-CM

## 2016-12-30 ENCOUNTER — Other Ambulatory Visit: Payer: BLUE CROSS/BLUE SHIELD

## 2016-12-30 DIAGNOSIS — O039 Complete or unspecified spontaneous abortion without complication: Secondary | ICD-10-CM

## 2016-12-31 LAB — BETA HCG QUANT (REF LAB): HCG QUANT: 1 m[IU]/mL

## 2017-01-02 ENCOUNTER — Encounter: Payer: Self-pay | Admitting: Obstetrics and Gynecology

## 2017-01-03 ENCOUNTER — Other Ambulatory Visit: Payer: Self-pay

## 2017-01-03 MED ORDER — NORGESTIMATE-ETH ESTRADIOL 0.25-35 MG-MCG PO TABS: 1.0000 | ORAL_TABLET | Freq: Every day | ORAL | 4 refills | Status: DC

## 2017-02-27 ENCOUNTER — Encounter: Payer: Self-pay | Admitting: Pediatrics

## 2017-04-26 ENCOUNTER — Encounter: Payer: Self-pay | Admitting: Obstetrics and Gynecology

## 2017-05-02 ENCOUNTER — Encounter: Payer: BLUE CROSS/BLUE SHIELD | Admitting: Obstetrics and Gynecology

## 2017-05-03 ENCOUNTER — Encounter: Payer: BLUE CROSS/BLUE SHIELD | Admitting: Obstetrics and Gynecology

## 2017-05-04 ENCOUNTER — Encounter: Payer: Self-pay | Admitting: Obstetrics and Gynecology

## 2017-05-04 ENCOUNTER — Ambulatory Visit (INDEPENDENT_AMBULATORY_CARE_PROVIDER_SITE_OTHER): Payer: BLUE CROSS/BLUE SHIELD | Admitting: Obstetrics and Gynecology

## 2017-05-04 VITALS — BP 115/76 | HR 79 | Ht 65.0 in | Wt 166.6 lb

## 2017-05-04 DIAGNOSIS — N939 Abnormal uterine and vaginal bleeding, unspecified: Secondary | ICD-10-CM | POA: Insufficient documentation

## 2017-05-04 DIAGNOSIS — Z842 Family history of other diseases of the genitourinary system: Secondary | ICD-10-CM | POA: Insufficient documentation

## 2017-05-04 DIAGNOSIS — R102 Pelvic and perineal pain: Secondary | ICD-10-CM | POA: Insufficient documentation

## 2017-05-04 NOTE — Progress Notes (Signed)
Chief complaint: 1.  Abnormal uterine bleeding on birth control pills 2.  Pelvic pain (associated with abnormal bleeding) 3.  Family history of endometriosis  Dawn Morris presents today for evaluation of above complaints. Status post spontaneous AB in July 2018.  Following resolution of hCG titers, she was started on birth control pills in September.  Normal cycles were noted in September and October; November was notable for 2 bleeding episodes; December was notable for 16 days of bleeding with clots, severe cramps and low back pain, which resolved several days ago. Normally she does not have significant dysmenorrhea. There is a family history of endometriosis in mom. She is not experiencing any deep thrusting dyspareunia  Past Medical History:  Diagnosis Date  . Echogenic intracardiac focus of fetus on prenatal ultrasound    09/10/2014  . Increased BMI   . Low-lying placenta    Past Surgical History:  Procedure Laterality Date  . TONSILLECTOMY  2005   Subjective: BP 115/76   Pulse 79   Ht 5\' 5"  (1.651 m)   Wt 166 lb 9.6 oz (75.6 kg)   LMP  (LMP Unknown)   BMI 27.72 kg/m  Pleasant well-appearing female no acute distress.  Alert and oriented. 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  New onset abnormal uterine bleeding on birth control pills 2.  Menorrhagia and associated dysmenorrhea 3.  Family history of endometriosis 4.  History of spontaneous abortion not requiring D&C July 2018  5.  Possible  endometriosis-doubtful  PLAN: 1.  Continue taking birth control pills as written 2.  Maintain menstrual calendar monitoring for abnormal uterine bleeding and dysmenorrhea 3.  Pelvic ultrasound 4.  Return in 3 months for follow-up and further management planning  A total of 15 minutes were spent face-to-face with the patient during this encounter and over half of that time dealt with counseling and coordination of care.  Brayton Mars, MD  Note: This dictation was prepared with Dragon dictation along with smaller phrase technology. Any transcriptional errors that result from this process are unintentional.

## 2017-05-04 NOTE — Patient Instructions (Signed)
1.  Continue with birth control pills daily 2.  Maintain menstrual calendar monitoring to assess abnormal uterine bleeding 3.  Pelvic ultrasound is scheduled 4.  Return in 3 months for follow-up

## 2017-05-11 ENCOUNTER — Ambulatory Visit (INDEPENDENT_AMBULATORY_CARE_PROVIDER_SITE_OTHER): Payer: BLUE CROSS/BLUE SHIELD

## 2017-05-11 DIAGNOSIS — N939 Abnormal uterine and vaginal bleeding, unspecified: Secondary | ICD-10-CM

## 2017-05-11 DIAGNOSIS — R102 Pelvic and perineal pain: Secondary | ICD-10-CM

## 2017-05-11 DIAGNOSIS — Z842 Family history of other diseases of the genitourinary system: Secondary | ICD-10-CM

## 2017-08-02 ENCOUNTER — Ambulatory Visit (INDEPENDENT_AMBULATORY_CARE_PROVIDER_SITE_OTHER): Payer: BLUE CROSS/BLUE SHIELD | Admitting: Obstetrics and Gynecology

## 2017-08-02 ENCOUNTER — Encounter: Payer: Self-pay | Admitting: Obstetrics and Gynecology

## 2017-08-02 VITALS — BP 110/73 | HR 73 | Ht 65.0 in | Wt 167.0 lb

## 2017-08-02 DIAGNOSIS — R102 Pelvic and perineal pain: Secondary | ICD-10-CM

## 2017-08-02 DIAGNOSIS — N939 Abnormal uterine and vaginal bleeding, unspecified: Secondary | ICD-10-CM

## 2017-08-02 MED ORDER — NORGESTIMATE-ETH ESTRADIOL 0.25-35 MG-MCG PO TABS: 1.0000 | ORAL_TABLET | Freq: Every day | ORAL | 4 refills | Status: DC

## 2017-08-02 NOTE — Patient Instructions (Signed)
1.  Continue taking Ortho-Cyclen oral contraceptives for cycle regulation 2.  Recommend ibuprofen 600 mg 4 times a day as needed for painful periods/low back pain 3.  Return in April as scheduled for annual exam

## 2017-08-02 NOTE — Progress Notes (Signed)
Chief complaint: 1.  History of abnormal uterine bleeding 2.  Family history of endometriosis 3.  History of recent miscarriage not requiring D&C  Patient presents for follow-up on abnormal menstrual cycles.  Over the past 3 months she has noted regular withdrawal bleeds lasting 3-4 days.  However, she has noted some moderate low back cramping which responds partially to acetaminophen.  There is a family history of endometriosis.  05/11/2017 ultrasound: ULTRASOUND REPORT  Location: [facility] Date of Service:  05/11/17   Indications: AUB Findings:  The uterus measures 8.1 x 5.1 x 3.6 cm. Echo texture is homogeneous without evidence of focal masses. The Endometrium measures 3.8 mm.  Right Ovary measures 2.4 x 1.8 x 1.6 cm and appears WNL.  Left Ovary measures 2.7 x 2.2 x 2.0 cm and appears WNL.  Survey of the adnexa demonstrates no adnexal masses. There is no free fluid in the cul de sac.  Impression: 1. Anteverted uterus appears of normal size and contour. 2. The endometrium measures 3.8 mm. 3. Bilateral ovaries appear WNL.  Recommendations: 1.Clinical correlation with the patient's History and Physical Exam.   Dario Ave, RDMS Brayton Mars, MD  Past medical history, past surgical history, problem list, medications, and allergies are reviewed  OBJECTIVE: BP 110/73   Pulse 73   Ht 5\' 5"  (1.651 m)   Wt 167 lb (75.8 kg)   LMP 07/17/2017 (Exact Date)   BMI 27.79 kg/m  Physical exam-deferred  ASSESSMENT: 1.  Abnormal uterine bleeding, now resolved on cyclic birth control pills 2.  Moderate dysmenorrhea in the form of low back pain perimenstrual  PLAN: 1.  Continue Ortho-Cyclen 2.  Recommend ibuprofen 600 mg 4 times a day as needed for dysmenorrhea 3.  Return in April for annual exam as scheduled.  A total of 15 minutes were spent face-to-face with the patient during this encounter and over half of that time dealt with counseling and  coordination of care.  Hassell Done A Defrancesco, MD  Note: This dictation was prepared with Dragon dictation along with smaller phrase technology. Any transcriptional errors that result from this process are unintentional.

## 2017-09-07 ENCOUNTER — Encounter: Payer: Self-pay | Admitting: Obstetrics and Gynecology

## 2017-09-07 ENCOUNTER — Ambulatory Visit (INDEPENDENT_AMBULATORY_CARE_PROVIDER_SITE_OTHER): Payer: Self-pay | Admitting: Obstetrics and Gynecology

## 2017-09-07 VITALS — BP 134/80 | HR 84 | Ht 65.0 in | Wt 162.5 lb

## 2017-09-07 DIAGNOSIS — Z202 Contact with and (suspected) exposure to infections with a predominantly sexual mode of transmission: Secondary | ICD-10-CM

## 2017-09-07 DIAGNOSIS — Z842 Family history of other diseases of the genitourinary system: Secondary | ICD-10-CM

## 2017-09-07 DIAGNOSIS — Z3041 Encounter for surveillance of contraceptive pills: Secondary | ICD-10-CM

## 2017-09-07 DIAGNOSIS — Z01419 Encounter for gynecological examination (general) (routine) without abnormal findings: Secondary | ICD-10-CM

## 2017-09-07 NOTE — Patient Instructions (Signed)
1.  Pap smear is done 2.  Self breast awareness encouraged 3.  Sprintec is refilled 4.  Screening labs are ordered 5.  Continue with healthy eating and exercise 6.  Return in 1 year for annual exam  Health Maintenance, Female Adopting a healthy lifestyle and getting preventive care can go a long way to promote health and wellness. Talk with your health care provider about what schedule of regular examinations is right for you. This is a good chance for you to check in with your provider about disease prevention and staying healthy. In between checkups, there are plenty of things you can do on your own. Experts have done a lot of research about which lifestyle changes and preventive measures are most likely to keep you healthy. Ask your health care provider for more information. Weight and diet Eat a healthy diet  Be sure to include plenty of vegetables, fruits, low-fat dairy products, and lean protein.  Do not eat a lot of foods high in solid fats, added sugars, or salt.  Get regular exercise. This is one of the most important things you can do for your health. ? Most adults should exercise for at least 150 minutes each week. The exercise should increase your heart rate and make you sweat (moderate-intensity exercise). ? Most adults should also do strengthening exercises at least twice a week. This is in addition to the moderate-intensity exercise.  Maintain a healthy weight  Body mass index (BMI) is a measurement that can be used to identify possible weight problems. It estimates body fat based on height and weight. Your health care provider can help determine your BMI and help you achieve or maintain a healthy weight.  For females 68 years of age and older: ? A BMI below 18.5 is considered underweight. ? A BMI of 18.5 to 24.9 is normal. ? A BMI of 25 to 29.9 is considered overweight. ? A BMI of 30 and above is considered obese.  Watch levels of cholesterol and blood lipids  You  should start having your blood tested for lipids and cholesterol at 28 years of age, then have this test every 5 years.  You may need to have your cholesterol levels checked more often if: ? Your lipid or cholesterol levels are high. ? You are older than 28 years of age. ? You are at high risk for heart disease.  Cancer screening Lung Cancer  Lung cancer screening is recommended for adults 39-28 years old who are at high risk for lung cancer because of a history of smoking.  A yearly low-dose CT scan of the lungs is recommended for people who: ? Currently smoke. ? Have quit within the past 15 years. ? Have at least a 30-pack-year history of smoking. A pack year is smoking an average of one pack of cigarettes a day for 1 year.  Yearly screening should continue until it has been 15 years since you quit.  Yearly screening should stop if you develop a health problem that would prevent you from having lung cancer treatment.  Breast Cancer  Practice breast self-awareness. This means understanding how your breasts normally appear and feel.  It also means doing regular breast self-exams. Let your health care provider know about any changes, no matter how small.  If you are in your 20s or 30s, you should have a clinical breast exam (CBE) by a health care provider every 1-3 years as part of a regular health exam.  If you are 40 or  older, have a CBE every year. Also consider having a breast X-ray (mammogram) every year.  If you have a family history of breast cancer, talk to your health care provider about genetic screening.  If you are at high risk for breast cancer, talk to your health care provider about having an MRI and a mammogram every year.  Breast cancer gene (BRCA) assessment is recommended for women who have family members with BRCA-related cancers. BRCA-related cancers include: ? Breast. ? Ovarian. ? Tubal. ? Peritoneal cancers.  Results of the assessment will determine the  need for genetic counseling and BRCA1 and BRCA2 testing.  Cervical Cancer Your health care provider may recommend that you be screened regularly for cancer of the pelvic organs (ovaries, uterus, and vagina). This screening involves a pelvic examination, including checking for microscopic changes to the surface of your cervix (Pap test). You may be encouraged to have this screening done every 3 years, beginning at age 60.  For women ages 100-65, health care providers may recommend pelvic exams and Pap testing every 3 years, or they may recommend the Pap and pelvic exam, combined with testing for human papilloma virus (HPV), every 5 years. Some types of HPV increase your risk of cervical cancer. Testing for HPV may also be done on women of any age with unclear Pap test results.  Other health care providers may not recommend any screening for nonpregnant women who are considered low risk for pelvic cancer and who do not have symptoms. Ask your health care provider if a screening pelvic exam is right for you.  If you have had past treatment for cervical cancer or a condition that could lead to cancer, you need Pap tests and screening for cancer for at least 20 years after your treatment. If Pap tests have been discontinued, your risk factors (such as having a new sexual partner) need to be reassessed to determine if screening should resume. Some women have medical problems that increase the chance of getting cervical cancer. In these cases, your health care provider may recommend more frequent screening and Pap tests.  Colorectal Cancer  This type of cancer can be detected and often prevented.  Routine colorectal cancer screening usually begins at 28 years of age and continues through 28 years of age.  Your health care provider may recommend screening at an earlier age if you have risk factors for colon cancer.  Your health care provider may also recommend using home test kits to check for hidden blood  in the stool.  A small camera at the end of a tube can be used to examine your colon directly (sigmoidoscopy or colonoscopy). This is done to check for the earliest forms of colorectal cancer.  Routine screening usually begins at age 32.  Direct examination of the colon should be repeated every 5-10 years through 28 years of age. However, you may need to be screened more often if early forms of precancerous polyps or small growths are found.  Skin Cancer  Check your skin from head to toe regularly.  Tell your health care provider about any new moles or changes in moles, especially if there is a change in a mole's shape or color.  Also tell your health care provider if you have a mole that is larger than the size of a pencil eraser.  Always use sunscreen. Apply sunscreen liberally and repeatedly throughout the day.  Protect yourself by wearing long sleeves, pants, a wide-brimmed hat, and sunglasses whenever you are outside.  Heart disease, diabetes, and high blood pressure  High blood pressure causes heart disease and increases the risk of stroke. High blood pressure is more likely to develop in: ? People who have blood pressure in the high end of the normal range (130-139/85-89 mm Hg). ? People who are overweight or obese. ? People who are African American.  If you are 28-75 years of age, have your blood pressure checked every 3-5 years. If you are 61 years of age or older, have your blood pressure checked every year. You should have your blood pressure measured twice-once when you are at a hospital or clinic, and once when you are not at a hospital or clinic. Record the average of the two measurements. To check your blood pressure when you are not at a hospital or clinic, you can use: ? An automated blood pressure machine at a pharmacy. ? A home blood pressure monitor.  If you are between 45 years and 3 years old, ask your health care provider if you should take aspirin to prevent  strokes.  Have regular diabetes screenings. This involves taking a blood sample to check your fasting blood sugar level. ? If you are at a normal weight and have a low risk for diabetes, have this test once every three years after 28 years of age. ? If you are overweight and have a high risk for diabetes, consider being tested at a younger age or more often. Preventing infection Hepatitis B  If you have a higher risk for hepatitis B, you should be screened for this virus. You are considered at high risk for hepatitis B if: ? You were born in a country where hepatitis B is common. Ask your health care provider which countries are considered high risk. ? Your parents were born in a high-risk country, and you have not been immunized against hepatitis B (hepatitis B vaccine). ? You have HIV or AIDS. ? You use needles to inject street drugs. ? You live with someone who has hepatitis B. ? You have had sex with someone who has hepatitis B. ? You get hemodialysis treatment. ? You take certain medicines for conditions, including cancer, organ transplantation, and autoimmune conditions.  Hepatitis C  Blood testing is recommended for: ? Everyone born from [birth year] through 1966. ? Anyone with known risk factors for hepatitis C.  Sexually transmitted infections (STIs)  You should be screened for sexually transmitted infections (STIs) including gonorrhea and chlamydia if: ? You are sexually active and are younger than 28 years of age. ? You are older than 28 years of age and your health care provider tells you that you are at risk for this type of infection. ? Your sexual activity has changed since you were last screened and you are at an increased risk for chlamydia or gonorrhea. Ask your health care provider if you are at risk.  If you do not have HIV, but are at risk, it may be recommended that you take a prescription medicine daily to prevent HIV infection. This is called pre-exposure prophylaxis  (PrEP). You are considered at risk if: ? You are sexually active and do not regularly use condoms or know the HIV status of your partner(s). ? You take drugs by injection. ? You are sexually active with a partner who has HIV.  Talk with your health care provider about whether you are at high risk of being infected with HIV. If you choose to begin PrEP, you should first be tested for HIV.  You should then be tested every 3 months for as long as you are taking PrEP. Pregnancy  If you are premenopausal and you may become pregnant, ask your health care provider about preconception counseling.  If you may become pregnant, take 400 to 800 micrograms (mcg) of folic acid every day.  If you want to prevent pregnancy, talk to your health care provider about birth control (contraception). Osteoporosis and menopause  Osteoporosis is a disease in which the bones lose minerals and strength with aging. This can result in serious bone fractures. Your risk for osteoporosis can be identified using a bone density scan.  If you are 10 years of age or older, or if you are at risk for osteoporosis and fractures, ask your health care provider if you should be screened.  Ask your health care provider whether you should take a calcium or vitamin D supplement to lower your risk for osteoporosis.  Menopause may have certain physical symptoms and risks.  Hormone replacement therapy may reduce some of these symptoms and risks. Talk to your health care provider about whether hormone replacement therapy is right for you. Follow these instructions at home:  Schedule regular health, dental, and eye exams.  Stay current with your immunizations.  Do not use any tobacco products including cigarettes, chewing tobacco, or electronic cigarettes.  If you are pregnant, do not drink alcohol.  If you are breastfeeding, limit how much and how often you drink alcohol.  Limit alcohol intake to no more than 1 drink per day for  nonpregnant women. One drink equals 12 ounces of beer, 5 ounces of wine, or 1 ounces of hard liquor.  Do not use street drugs.  Do not share needles.  Ask your health care provider for help if you need support or information about quitting drugs.  Tell your health care provider if you often feel depressed.  Tell your health care provider if you have ever been abused or do not feel safe at home. This information is not intended to replace advice given to you by your health care provider. Make sure you discuss any questions you have with your health care provider. Document Released: 11/15/2010 Document Revised: 10/08/2015 Document Reviewed: 02/03/2015 Elsevier Interactive Patient Education  [employer].

## 2017-09-07 NOTE — Progress Notes (Signed)
Patient ID: Dawn Morris, female   DOB: Feb 16, 1991, 27 y.o.   MRN: [id number] ANNUAL PREVENTATIVE CARE GYN  ENCOUNTER NOTE  Subjective:       Dawn Morris is a 27 y.o. G9P1001 female here for a routine annual gynecologic exam.  Current complaints: 1. None  Menstrual cycles are regular. No dysmenorrhea. 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 No major interval health issues. Bowel function is normal Bladder function is normal..   Gynecologic History Patient's last menstrual period was 08/15/2017 (exact date). Contraception: sprintec Last Pap: 07/2015 neg/neg/neg. Results were: normal Last mammogram: n./a Results were: n/a  Obstetric History OB History  Gravida Para Term Preterm AB Living  2 1 1   1 1   SAB TAB Ectopic Multiple Live Births  1     0 1    # Outcome Date GA Lbr Len/2nd Weight Sex Delivery Anes PTL Lv  2 SAB 2018          1 Term 01/30/15 [redacted]w[redacted]d 11:52 / 01:14 8 lb 0.4 oz (3.64 kg) F Vag-Spont EPI  LIV     Birth Comments: none noted    Past Medical History:  Diagnosis Date  . Echogenic intracardiac focus of fetus on prenatal ultrasound    09/10/2014  . Increased BMI   . Low-lying placenta   . Melanoma [facility])     Past Surgical History:  Procedure Laterality Date  . MELANOMA EXCISION    . TONSILLECTOMY  2005    Current Outpatient Medications on File Prior to Visit  Medication Sig Dispense Refill  . norgestimate-ethinyl estradiol (ORTHO-CYCLEN,SPRINTEC,PREVIFEM) 0.25-35 MG-MCG tablet Take 1 tablet by mouth daily. 3 Package 4  . Prenatal Vit-Fe Fumarate-FA (MULTIVITAMIN-PRENATAL) 27-0.8 MG TABS tablet Take 1 tablet by mouth daily at 12 noon.     No current facility-administered medications on file prior to visit.     No Known Allergies  Social History   Socioeconomic History  . Marital status: Single    Spouse name: Not on file  . Number of children: Not on file  . Years of education: Not on file  . Highest education level: Not  on file  Occupational History  . Not on file  Social Needs  . Financial resource strain: Not on file  . Food insecurity:    Worry: Not on file    Inability: Not on file  . Transportation needs:    Medical: Not on file    Non-medical: Not on file  Tobacco Use  . Smoking status: Never Smoker  . Smokeless tobacco: Never Used  Substance and Sexual Activity  . Alcohol use: No  . Drug use: No  . Sexual activity: Yes    Birth control/protection: Pill  Lifestyle  . Physical activity:    Days per week: 3 days    Minutes per session: 30 min  . Stress: Not on file  Relationships  . Social connections:    Talks on phone: Not on file    Gets together: Not on file    Attends religious service: Not on file    Active member of club or organization: Not on file    Attends meetings of clubs or organizations: Not on file    Relationship status: Not on file  . Intimate partner violence:    Fear of current or ex partner: Not on file    Emotionally abused: Not on file    Physically abused: Not on file    Forced sexual activity: Not on  file  Other Topics Concern  . Not on file  Social History Narrative  . Not on file    Family History  Problem Relation Age of Onset  . Cancer Neg Hx   . Diabetes Neg Hx   . Heart disease Neg Hx     The following portions of the patient's history were reviewed and updated as appropriate: allergies, current medications, past family history, past medical history, past social history, past surgical history and problem list.  Review of Systems Review of Systems  Constitutional: Negative.   HENT: Negative.   Eyes: Negative.   Respiratory: Negative.   Cardiovascular: Negative.   Gastrointestinal: Negative.   Genitourinary: Negative.   Musculoskeletal: Negative.   Skin: Negative.   Neurological: Negative.   Endo/Heme/Allergies: Negative.   Psychiatric/Behavioral: Negative.     Objective:   BP 134/80   Pulse 84   Ht 5\' 5"  (1.651 m)   Wt 162 lb  8 oz (73.7 kg)   LMP 08/15/2017 (Exact Date)   BMI 27.04 kg/m   CONSTITUTIONAL: Well-developed, well-nourished female in no acute distress.  PSYCHIATRIC: Normal mood and affect. Normal behavior. Normal judgment and thought content. [place]: Alert and oriented to person, place, and time. Normal muscle tone coordination. No cranial nerve deficit noted. HENT:  Normocephalic, atraumatic, External right and left ear normal.  EYES: Conjunctivae and EOM are normal. . No scleral icterus.  NECK: Normal range of motion, supple, no masses.  Normal thyroid.  SKIN: Skin is warm and dry. No rash noted. Not diaphoretic. No erythema. No pallor. CARDIOVASCULAR: Normal heart rate noted, regular rhythm, no murmur. RESPIRATORY: Clear to auscultation bilaterally. Effort and breath sounds normal, no problems with respiration noted. BREASTS: Symmetric in size. No masses, skin changes, nipple drainage, or lymphadenopathy. ABDOMEN: Soft, no distention noted.  No tenderness, rebound or guarding.  BLADDER: Normal PELVIC:  External Genitalia: Normal  BUS: Normal  Vagina: Normal; no significant discharge  Cervix: Normal; no lesions; no cervical motion tenderness; eversion present; Pap smear done  Uterus: Normal; midplane, normal size and shape, mobile, nontender  Adnexa: Normal; nonpalpable and nontender  RV: External Exam NormaI  MUSCULOSKELETAL: Normal range of motion. No tenderness.  No cyanosis, clubbing, or edema.  LYMPHATIC: No Axillary, Supraclavicular, or Inguinal Adenopathy.    Assessment:   Annual gynecologic examination 27 y.o. Contraception:sprintec   Plan:  Pap: pap w rflx ct/ng Mammogram: Not Indicated Stool Guaiac Testing:  Not Indicated Labs: vit d fbs a1c lipid tsh to be obtained within the next 1-2 weeks Routine preventative health maintenance measures emphasized: Exercise/Diet/Weight control, Tobacco Warnings and Alcohol/Substance use risks Combined oral contraceptive is  prescribed Return to [place], [place]    Note: This dictation was prepared with Dragon dictation along with smaller phrase technology. Any transcriptional errors that result from this process are unintentional.

## 2017-09-09 LAB — PAP IG, CT-NG, RFX HPV ASCU
CHLAMYDIA, NUC. ACID AMP: NEGATIVE
GONOCOCCUS BY NUCLEIC ACID AMP: NEGATIVE
PAP SMEAR COMMENT: 0

## 2017-11-23 ENCOUNTER — Emergency Department
Admission: EM | Admit: 2017-11-23 | Discharge: 2017-11-24 | Disposition: A | Discharge status: Home / Self Care | Payer: Self-pay | Attending: Emergency Medicine | Admitting: Emergency Medicine

## 2017-11-23 ENCOUNTER — Encounter: Payer: Self-pay | Admitting: Emergency Medicine

## 2017-11-23 ENCOUNTER — Emergency Department: Payer: Self-pay

## 2017-11-23 DIAGNOSIS — M7660 Achilles tendinitis, unspecified leg: Secondary | ICD-10-CM

## 2017-11-23 DIAGNOSIS — M25571 Pain in right ankle and joints of right foot: Secondary | ICD-10-CM | POA: Insufficient documentation

## 2017-11-23 DIAGNOSIS — Z79899 Other long term (current) drug therapy: Secondary | ICD-10-CM | POA: Insufficient documentation

## 2017-11-23 DIAGNOSIS — R202 Paresthesia of skin: Secondary | ICD-10-CM | POA: Insufficient documentation

## 2017-11-23 DIAGNOSIS — Z85820 Personal history of malignant melanoma of skin: Secondary | ICD-10-CM | POA: Insufficient documentation

## 2017-11-23 MED ORDER — MELOXICAM 15 MG PO TABS: 15.0000 mg | ORAL_TABLET | Freq: Every day | ORAL | 1 refills | Status: AC

## 2017-11-23 MED ORDER — OXYCODONE-ACETAMINOPHEN 5-325 MG PO TABS
1.0000 | ORAL_TABLET | Freq: Once | ORAL | Status: AC
  Administered 2017-11-23: 1 via ORAL
  Filled 2017-11-23: qty 1

## 2017-11-23 MED ORDER — OXYCODONE-ACETAMINOPHEN 5-325 MG PO TABS: 1.0000 | ORAL_TABLET | Freq: Four times a day (QID) | ORAL | 0 refills | Status: AC | PRN

## 2017-11-23 NOTE — ED Notes (Signed)
Probation officer assisted pt to restroom.

## 2017-11-23 NOTE — ED Triage Notes (Signed)
Pt reports thinks she pulled her achilles tendon playing softball this evening. Pt states painful to put weight on her right leg, pain below her knee.

## 2017-11-23 NOTE — ED Provider Notes (Signed)
[facility] Emergency Department Provider Note  ____________________________________________  Time seen: Approximately 11:58 PM  I have reviewed the triage vital signs and the nursing notes.   HISTORY  Chief Complaint Leg Pain and Leg Swelling    HPI Dawn Morris is a 27 y.o. female presents to the emergency department after she felt and heard a pop at right posterior heel.  Patient reports exquisite pain over the insertion for the Achilles tendon.  She has never experienced symptoms like this in the past.  She rates her pain at 10 out of 10 intensity and describes it as throbbing.  She has not been able to ambulate since incident occurred.  Patient does report some tingling in right foot.   Past Medical History:  Diagnosis Date  . Echogenic intracardiac focus of fetus on prenatal ultrasound    09/10/2014  . Increased BMI   . Low-lying placenta   . Melanoma [facility])     Patient Active Problem List   Diagnosis Date Noted  . Encounter for surveillance of contraceptive pills 09/07/2017  . Family history of endometriosis 05/04/2017  . Pelvic pain 05/04/2017  . Spontaneous abortion 11/24/2016  . Left lower quadrant pain 11/22/2016  . SVD (spontaneous vaginal delivery) 01/31/2015  . 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    Past Surgical History:  Procedure Laterality Date  . MELANOMA EXCISION    . TONSILLECTOMY  2005    Prior to Admission medications   Medication Sig Start Date End Date Taking? Authorizing Provider  meloxicam (MOBIC) 15 MG tablet Take 1 tablet (15 mg total) by mouth daily for 7 days. 11/23/17 11/30/17  Lannie Fields, PA-C  norgestimate-ethinyl estradiol (ORTHO-CYCLEN,SPRINTEC,PREVIFEM) 0.25-35 MG-MCG tablet Take 1 tablet by mouth daily. 08/02/17   Defrancesco, Alanda Slim, MD  oxyCODONE-acetaminophen (PERCOCET/ROXICET) 5-325 MG tablet Take 1 tablet by mouth every 6 (six) hours as needed for up to 3 days for severe pain. 11/23/17 11/26/17   Lannie Fields, PA-C  Prenatal Vit-Fe Fumarate-FA (MULTIVITAMIN-PRENATAL) 27-0.8 MG TABS tablet Take 1 tablet by mouth daily at 12 noon.    [provider]    Allergies Patient has no known allergies.  Family History  Problem Relation Age of Onset  . Cancer Neg Hx   . Diabetes Neg Hx   . Heart disease Neg Hx     Social History Social History   Tobacco Use  . Smoking status: Never Smoker  . Smokeless tobacco: Never Used  Substance Use Topics  . Alcohol use: No  . Drug use: No     Review of Systems  Constitutional: No fever/chills Eyes: No visual changes. No discharge ENT: No upper respiratory complaints. Cardiovascular: no chest pain. Respiratory: no cough. No SOB. Gastrointestinal: No abdominal pain.  No nausea, no vomiting.  No diarrhea.  No constipation. Musculoskeletal: Patient has right ankle/calf pain. Skin: Negative for rash, abrasions, lacerations, ecchymosis. Neurological: Negative for headaches, focal weakness or numbness.  ____________________________________________   PHYSICAL EXAM:  VITAL SIGNS: ED Triage Vitals  Enc Vitals Group     BP 11/23/17 2307 (!) 142/96     Pulse Rate 11/23/17 2307 93     Resp 11/23/17 2307 16     Temp 11/23/17 2307 98.9 F (37.2 C)     Temp Source 11/23/17 2307 Oral     SpO2 11/23/17 2307 99 %     Weight 11/23/17 2301 160 lb (72.6 kg)     Height 11/23/17 2301 5\' 6"  (1.676 m)     Head  Circumference --      Peak Flow --      Pain Score 11/23/17 2301 10     Pain Loc --      Pain Edu? --      Excl. in [place]? --      Constitutional: Alert and oriented. Well appearing and in no acute distress. Eyes: Conjunctivae are normal. PERRL. EOMI. Head: Atraumatic. Cardiovascular: Normal rate, regular rhythm. Normal S1 and S2.  Good peripheral circulation. Respiratory: Normal respiratory effort without tachypnea or retractions. Lungs CTAB. Good air entry to the bases with no decreased or absent breath  sounds. Musculoskeletal: Patient is unable to perform full range of motion at the right ankle, likely secondary to pain.  She is able to move all 5 right toes.  No pain was elicited over the metatarsals, right  Patient has exquisite tenderness over the insertion for the Achilles tendon and to the right calf.  Palpable dorsalis pedis pulse,  Right.  Capillary refill is less than 2 seconds. Neurologic:  Normal speech and language. No gross focal neurologic deficits are appreciated.  Skin:  Skin is warm, dry and intact. No rash noted. Psychiatric: Mood and affect are normal. Speech and behavior are normal. Patient exhibits appropriate insight and judgement.   ____________________________________________   LABS (all labs ordered are listed, but only abnormal results are displayed)  Labs Reviewed - No data to display ____________________________________________  EKG   ____________________________________________  RADIOLOGY I personally viewed and evaluated these images as part of my medical decision making, as well as reviewing the written report by the radiologist.  Dg Ankle Complete Right  Result Date: 11/23/2017 CLINICAL DATA:  Achilles tendon rupture, rule out fracture. EXAM: RIGHT ANKLE - COMPLETE 3+ VIEW COMPARISON:  04/27/2012 FINDINGS: An Achilles shadow is seen. There is some reticulation of fat deep to the soleus and Achilles. Negative for fracture. No malalignment. IMPRESSION: Negative for fracture or malalignment.  Preserved Achilles shadow. Electronically Signed   By: Monte Fantasia M.D.   On: 11/23/2017 23:40    ____________________________________________    PROCEDURES  Procedure(s) performed:    Procedures    Medications  oxyCODONE-acetaminophen (PERCOCET/ROXICET) 5-325 MG per tablet 1 tablet (1 tablet Oral Given 11/23/17 2325)     ____________________________________________   INITIAL IMPRESSION / ASSESSMENT AND PLAN / ED COURSE  Pertinent labs & imaging  results that were available during my care of the patient were reviewed by me and considered in my medical decision making (see chart for details).  Review of the [place] CSRS was performed in accordance of the [place] prior to dispensing any controlled drugs.    Assessment and plan Achilles tendon pain Patient presents to the emergency department with right posterior ankle pain along the distribution of the insertion for the Achilles tendon after she heard a pop while playing baseball earlier today.  X-ray examination reveals no acute abnormalities.  Physical exam findings are concerning for a Achilles tendon tear.  Patient was splinted in plantar flexion and crutches were provided.  Roxicet was given for pain as well as meloxicam and she was referred to orthopedics.  All patient questions were answered.     ____________________________________________  FINAL CLINICAL IMPRESSION(S) / ED DIAGNOSES  Final diagnoses:  Achilles tendon pain      NEW MEDICATIONS STARTED DURING THIS VISIT:  ED Discharge Orders        Ordered    oxyCODONE-acetaminophen (PERCOCET/ROXICET) 5-325 MG tablet  Every 6 hours PRN     11/23/17 2351  meloxicam (MOBIC) 15 MG tablet  Daily     11/23/17 2351          This chart was dictated using voice recognition software/Dragon. Despite best efforts to proofread, errors can occur which can change the meaning. Any change was purely unintentional.    Lannie Fields, PA-C 11/24/17 Lynnell Catalan    Nance Pear, MD 11/24/17 [phone]

## 2017-11-24 NOTE — ED Notes (Signed)

## 2017-12-05 ENCOUNTER — Other Ambulatory Visit: Payer: Self-pay | Admitting: Specialist

## 2017-12-05 MED ORDER — CLINDAMYCIN PHOSPHATE 600 MG/50ML IV SOLN
600.0000 mg | INTRAVENOUS | Status: AC
  Administered 2017-12-06: 600 mg via INTRAVENOUS

## 2017-12-05 MED ORDER — CEFAZOLIN SODIUM-DEXTROSE 2-4 GM/100ML-% IV SOLN
2.0000 g | INTRAVENOUS | Status: AC
  Administered 2017-12-06: 2 g via INTRAVENOUS

## 2017-12-06 ENCOUNTER — Ambulatory Visit: Payer: No Typology Code available for payment source | Admitting: Anesthesiology

## 2017-12-06 ENCOUNTER — Ambulatory Visit
Admission: RE | Admit: 2017-12-06 | Discharge: 2017-12-06 | Disposition: A | Discharge status: Home / Self Care | Payer: No Typology Code available for payment source | Source: Ambulatory Visit | Attending: Specialist | Admitting: Specialist

## 2017-12-06 ENCOUNTER — Encounter
Admission: RE | Disposition: A | Discharge status: Home / Self Care | Payer: Self-pay | Source: Ambulatory Visit | Attending: Specialist

## 2017-12-06 DIAGNOSIS — F329 Major depressive disorder, single episode, unspecified: Secondary | ICD-10-CM | POA: Insufficient documentation

## 2017-12-06 DIAGNOSIS — Y929 Unspecified place or not applicable: Secondary | ICD-10-CM | POA: Insufficient documentation

## 2017-12-06 DIAGNOSIS — Z79899 Other long term (current) drug therapy: Secondary | ICD-10-CM | POA: Insufficient documentation

## 2017-12-06 DIAGNOSIS — S86011A Strain of right Achilles tendon, initial encounter: Secondary | ICD-10-CM | POA: Insufficient documentation

## 2017-12-06 DIAGNOSIS — X58XXXA Exposure to other specified factors, initial encounter: Secondary | ICD-10-CM | POA: Insufficient documentation

## 2017-12-06 DIAGNOSIS — Y9301 Activity, walking, marching and hiking: Secondary | ICD-10-CM | POA: Insufficient documentation

## 2017-12-06 HISTORY — PX: ACHILLES TENDON SURGERY: SHX542

## 2017-12-06 LAB — POCT PREGNANCY, URINE: PREG TEST UR: NEGATIVE

## 2017-12-06 SURGERY — REPAIR, TENDON, ACHILLES
Anesthesia: General | Site: Foot | Laterality: Right | Wound class: "Clean "

## 2017-12-06 MED ORDER — MELOXICAM 7.5 MG PO TABS: 15.0000 mg | ORAL_TABLET | Freq: Once | ORAL | Status: DC

## 2017-12-06 MED ORDER — LIDOCAINE HCL (CARDIAC) PF 100 MG/5ML IV SOSY
PREFILLED_SYRINGE | INTRAVENOUS | Status: DC | PRN
  Administered 2017-12-06: 80 mg via INTRAVENOUS

## 2017-12-06 MED ORDER — FENTANYL CITRATE (PF) 100 MCG/2ML IJ SOLN
INTRAMUSCULAR | Status: DC | PRN
  Administered 2017-12-06 (×2): 50 ug via INTRAVENOUS

## 2017-12-06 MED ORDER — SUCCINYLCHOLINE CHLORIDE 20 MG/ML IJ SOLN
INTRAMUSCULAR | Status: AC
  Filled 2017-12-06: qty 1

## 2017-12-06 MED ORDER — OXYCODONE HCL 5 MG PO TABS
5.0000 mg | ORAL_TABLET | Freq: Once | ORAL | Status: AC | PRN
  Administered 2017-12-06: 5 mg via ORAL

## 2017-12-06 MED ORDER — ASPIRIN 325 MG PO TABS
325.0000 mg | ORAL_TABLET | Freq: Two times a day (BID) | ORAL | Status: DC
  Filled 2017-12-06: qty 1

## 2017-12-06 MED ORDER — PHENYLEPHRINE HCL 10 MG/ML IJ SOLN
INTRAMUSCULAR | Status: AC
  Filled 2017-12-06: qty 1

## 2017-12-06 MED ORDER — DIPHENHYDRAMINE HCL 50 MG/ML IJ SOLN
INTRAMUSCULAR | Status: AC
  Filled 2017-12-06: qty 1

## 2017-12-06 MED ORDER — OXYCODONE HCL 5 MG/5ML PO SOLN: 5.0000 mg | Freq: Once | ORAL | Status: AC | PRN

## 2017-12-06 MED ORDER — FENTANYL CITRATE (PF) 100 MCG/2ML IJ SOLN
INTRAMUSCULAR | Status: AC
  Filled 2017-12-06: qty 2

## 2017-12-06 MED ORDER — LACTATED RINGERS IV SOLN
INTRAVENOUS | Status: DC
  Administered 2017-12-06: 11:00:00 via INTRAVENOUS

## 2017-12-06 MED ORDER — FENTANYL CITRATE (PF) 100 MCG/2ML IJ SOLN
25.0000 ug | INTRAMUSCULAR | Status: DC | PRN
  Administered 2017-12-06: 25 ug via INTRAVENOUS
  Administered 2017-12-06: 50 ug via INTRAVENOUS
  Administered 2017-12-06: 25 ug via INTRAVENOUS

## 2017-12-06 MED ORDER — CLINDAMYCIN PHOSPHATE 600 MG/50ML IV SOLN
INTRAVENOUS | Status: AC
  Filled 2017-12-06: qty 50

## 2017-12-06 MED ORDER — MIDAZOLAM HCL 2 MG/2ML IJ SOLN
INTRAMUSCULAR | Status: AC
  Filled 2017-12-06: qty 2

## 2017-12-06 MED ORDER — ONDANSETRON HCL 4 MG/2ML IJ SOLN
INTRAMUSCULAR | Status: DC | PRN
Start: 2017-12-06 — End: 2017-12-06
  Administered 2017-12-06: 4 mg via INTRAVENOUS

## 2017-12-06 MED ORDER — DEXAMETHASONE SODIUM PHOSPHATE 10 MG/ML IJ SOLN
INTRAMUSCULAR | Status: DC | PRN
  Administered 2017-12-06: 5 mg via INTRAVENOUS

## 2017-12-06 MED ORDER — NEOMYCIN-POLYMYXIN B GU 40-200000 IR SOLN
Status: DC | PRN
  Administered 2017-12-06: 4 mL

## 2017-12-06 MED ORDER — CHLORHEXIDINE GLUCONATE CLOTH 2 % EX PADS: 6.0000 | MEDICATED_PAD | Freq: Once | CUTANEOUS | Status: DC

## 2017-12-06 MED ORDER — CEFAZOLIN SODIUM-DEXTROSE 2-4 GM/100ML-% IV SOLN
INTRAVENOUS | Status: AC
  Filled 2017-12-06: qty 100

## 2017-12-06 MED ORDER — ASPIRIN EC 325 MG PO TBEC: 325.0000 mg | DELAYED_RELEASE_TABLET | Freq: Two times a day (BID) | ORAL | 0 refills | Status: DC

## 2017-12-06 MED ORDER — SODIUM CHLORIDE FLUSH 0.9 % IV SOLN
INTRAVENOUS | Status: AC
  Filled 2017-12-06: qty 20

## 2017-12-06 MED ORDER — BUPIVACAINE HCL (PF) 0.5 % IJ SOLN
INTRAMUSCULAR | Status: AC
  Filled 2017-12-06: qty 30

## 2017-12-06 MED ORDER — BUPIVACAINE HCL (PF) 0.5 % IJ SOLN
INTRAMUSCULAR | Status: DC | PRN
  Administered 2017-12-06: 28 mL

## 2017-12-06 MED ORDER — GABAPENTIN 400 MG PO CAPS: 400.0000 mg | ORAL_CAPSULE | Freq: Three times a day (TID) | ORAL | 3 refills | Status: DC

## 2017-12-06 MED ORDER — ROCURONIUM BROMIDE 50 MG/5ML IV SOLN
INTRAVENOUS | Status: AC
  Filled 2017-12-06: qty 1

## 2017-12-06 MED ORDER — LIDOCAINE HCL (PF) 2 % IJ SOLN
INTRAMUSCULAR | Status: AC
  Filled 2017-12-06: qty 10

## 2017-12-06 MED ORDER — PROPOFOL 10 MG/ML IV BOLUS
INTRAVENOUS | Status: DC | PRN
  Administered 2017-12-06: 150 mg via INTRAVENOUS

## 2017-12-06 MED ORDER — FAMOTIDINE 20 MG PO TABS
ORAL_TABLET | ORAL | Status: AC
  Administered 2017-12-06: 20 mg via ORAL
  Filled 2017-12-06: qty 1

## 2017-12-06 MED ORDER — PROPOFOL 10 MG/ML IV BOLUS
INTRAVENOUS | Status: AC
  Filled 2017-12-06: qty 20

## 2017-12-06 MED ORDER — ROCURONIUM BROMIDE 100 MG/10ML IV SOLN
INTRAVENOUS | Status: DC | PRN
  Administered 2017-12-06: 10 mg via INTRAVENOUS
  Administered 2017-12-06: 35 mg via INTRAVENOUS
  Administered 2017-12-06: 20 mg via INTRAVENOUS
  Administered 2017-12-06: 5 mg via INTRAVENOUS

## 2017-12-06 MED ORDER — FAMOTIDINE 20 MG PO TABS
20.0000 mg | ORAL_TABLET | Freq: Once | ORAL | Status: AC
  Administered 2017-12-06: 20 mg via ORAL

## 2017-12-06 MED ORDER — DIPHENHYDRAMINE HCL 50 MG/ML IJ SOLN
INTRAMUSCULAR | Status: DC | PRN
  Administered 2017-12-06: 12.5 mg via INTRAVENOUS

## 2017-12-06 MED ORDER — DEXMEDETOMIDINE HCL 200 MCG/2ML IV SOLN
INTRAVENOUS | Status: DC | PRN
  Administered 2017-12-06 (×2): 8 ug via INTRAVENOUS

## 2017-12-06 MED ORDER — NEOMYCIN-POLYMYXIN B GU 40-200000 IR SOLN
Status: AC
  Filled 2017-12-06: qty 4

## 2017-12-06 MED ORDER — FENTANYL CITRATE (PF) 100 MCG/2ML IJ SOLN
INTRAMUSCULAR | Status: AC
  Administered 2017-12-06: 25 ug via INTRAVENOUS
  Filled 2017-12-06: qty 2

## 2017-12-06 MED ORDER — GABAPENTIN 300 MG PO CAPS: 300.0000 mg | ORAL_CAPSULE | ORAL | Status: DC

## 2017-12-06 MED ORDER — SUGAMMADEX SODIUM 200 MG/2ML IV SOLN
INTRAVENOUS | Status: DC | PRN
  Administered 2017-12-06: 145.2 mg via INTRAVENOUS

## 2017-12-06 MED ORDER — PHENYLEPHRINE HCL 10 MG/ML IJ SOLN
INTRAMUSCULAR | Status: DC | PRN
  Administered 2017-12-06: 100 ug via INTRAVENOUS

## 2017-12-06 MED ORDER — HYDROCODONE-ACETAMINOPHEN 7.5-325 MG PO TABS: 1.0000 | ORAL_TABLET | Freq: Four times a day (QID) | ORAL | 0 refills | Status: DC | PRN

## 2017-12-06 MED ORDER — MIDAZOLAM HCL 2 MG/2ML IJ SOLN
INTRAMUSCULAR | Status: DC | PRN
  Administered 2017-12-06: 2 mg via INTRAVENOUS

## 2017-12-06 MED ORDER — OXYCODONE HCL 5 MG PO TABS
ORAL_TABLET | ORAL | Status: AC
  Filled 2017-12-06: qty 1

## 2017-12-06 SURGICAL SUPPLY — 40 items
BNDG COHESIVE 4X5 TAN STRL (GAUZE/BANDAGES/DRESSINGS) ×2 IMPLANT
BNDG ESMARK 6X12 TAN STRL LF (GAUZE/BANDAGES/DRESSINGS) ×2 IMPLANT
CANISTER SUCT 1200ML W/VALVE (MISCELLANEOUS) ×2 IMPLANT
CHLORAPREP W/TINT 26ML (MISCELLANEOUS) ×3 IMPLANT
CUFF TOURN 24 STER (MISCELLANEOUS) IMPLANT
CUFF TOURN 30 STER DUAL PORT (MISCELLANEOUS) ×1 IMPLANT
ELECT REM PT RETURN 9FT ADLT (ELECTROSURGICAL) ×2
ELECTRODE REM PT RTRN 9FT ADLT (ELECTROSURGICAL) ×1 IMPLANT
GAUZE PETRO XEROFOAM 1X8 (MISCELLANEOUS) ×2 IMPLANT
GAUZE SPONGE 4X4 12PLY STRL (GAUZE/BANDAGES/DRESSINGS) ×3 IMPLANT
GLOVE BIO SURGEON STRL SZ8 (GLOVE) ×2 IMPLANT
GLOVE INDICATOR 8.0 STRL GRN (GLOVE) ×2 IMPLANT
GLOVE SURG ORTHO 8.5 STRL (GLOVE) ×2 IMPLANT
GOWN STRL REUS W/ TWL LRG LVL3 (GOWN DISPOSABLE) ×1 IMPLANT
GOWN STRL REUS W/TWL LRG LVL3 (GOWN DISPOSABLE) ×1
GOWN STRL REUS W/TWL LRG LVL4 (GOWN DISPOSABLE) ×2 IMPLANT
KIT TURNOVER KIT A (KITS) ×2 IMPLANT
LABEL OR SOLS (LABEL) ×1 IMPLANT
NDL SPNL 20GX3.5 QUINCKE YW (NEEDLE) IMPLANT
NEEDLE SPNL 20GX3.5 QUINCKE YW (NEEDLE) ×2 IMPLANT
NS IRRIG 1000ML POUR BTL (IV SOLUTION) ×2 IMPLANT
PACK EXTREMITY ARMC (MISCELLANEOUS) ×2 IMPLANT
PADDING CAST 4IN STRL (MISCELLANEOUS) ×3
PADDING CAST BLEND 4X4 STRL (MISCELLANEOUS) ×2 IMPLANT
PADDING CAST BLEND 6X4 STRL (MISCELLANEOUS) ×2 IMPLANT
PADDING STRL CAST 6IN (MISCELLANEOUS)
SOL PREP PVP 2OZ (MISCELLANEOUS)
SOLUTION PREP PVP 2OZ (MISCELLANEOUS) ×1 IMPLANT
SPONGE LAP 18X18 RF (DISPOSABLE) ×1 IMPLANT
STAPLER SKIN PROX 35W (STAPLE) ×2 IMPLANT
STOCKINETTE BIAS CUT 6 980064 (GAUZE/BANDAGES/DRESSINGS) ×2 IMPLANT
STOCKINETTE STRL 6IN 960660 (GAUZE/BANDAGES/DRESSINGS) ×2 IMPLANT
SUT ETHIBOND 3-0  EXTR (SUTURE)
SUT ETHIBOND 3-0 EXTR (SUTURE) ×1 IMPLANT
SUT ORTHOCORD OS-6 NDL 36 (SUTURE) ×2 IMPLANT
SUT QUILL PDO 0 36 36 VIOLET (SUTURE) ×1 IMPLANT
SUT V-LOC 90 ABS DVC 3-0 CL (SUTURE) ×1 IMPLANT
SUT VIC AB 3-0 SH 27 (SUTURE) ×1
SUT VIC AB 3-0 SH 27X BRD (SUTURE) ×1 IMPLANT
TAPE CAST 4X4 WHT DELT (MISCELLANEOUS) ×2 IMPLANT

## 2017-12-06 NOTE — Anesthesia Post-op Follow-up Note (Signed)
Anesthesia QCDR form completed.        

## 2017-12-06 NOTE — Anesthesia Preprocedure Evaluation (Signed)
Anesthesia Evaluation  Patient identified by MRN, date of birth, ID band Patient awake    Reviewed: Allergy & Precautions, H&P , NPO status , Patient's Chart, lab work & pertinent test results  History of Anesthesia Complications Negative for: history of anesthetic complications  Airway Mallampati: II  TM Distance: >3 FB Neck ROM: full    Dental  (+) Chipped   Pulmonary neg pulmonary ROS, neg shortness of breath,           Cardiovascular Exercise Tolerance: Good (-) angina(-) Past MI and (-) DOE negative cardio ROS       Neuro/Psych PSYCHIATRIC DISORDERS Depression negative neurological ROS  negative psych ROS   GI/Hepatic negative GI ROS, Neg liver ROS, neg GERD  ,  Endo/Other  negative endocrine ROS  Renal/GU      Musculoskeletal   Abdominal   Peds  Hematology negative hematology ROS (+)   Anesthesia Other Findings Past Medical History: No date: Echogenic intracardiac focus of fetus on prenatal ultrasound     Comment:  09/10/2014 No date: Increased BMI No date: Low-lying placenta No date: Melanoma [facility])  Past Surgical History: No date: MELANOMA EXCISION 2005: TONSILLECTOMY     Reproductive/Obstetrics negative OB ROS                             Anesthesia Physical Anesthesia Plan  ASA: II  Anesthesia Plan: General ETT   Post-op Pain Management: GA combined w/ Regional for post-op pain   Induction: Intravenous  PONV Risk Score and Plan: Ondansetron, Dexamethasone and Midazolam  Airway Management Planned: Oral ETT  Additional Equipment:   Intra-op Plan:   Post-operative Plan: Extubation in OR  Informed Consent: I have reviewed the patients History and Physical, chart, labs and discussed the procedure including the risks, benefits and alternatives for the proposed anesthesia with the patient or authorized representative who has indicated his/her understanding and  acceptance.   Dental Advisory Given  Plan Discussed with: Anesthesiologist, CRNA and Surgeon  Anesthesia Plan Comments: (Patient consented for risks of anesthesia including but not limited to:  - adverse reactions to medications - damage to teeth, lips or other oral mucosa - sore throat or hoarseness - Damage to heart, brain, lungs or loss of life  Patient voiced understanding.)        Anesthesia Quick Evaluation

## 2017-12-06 NOTE — Anesthesia Postprocedure Evaluation (Signed)
Anesthesia Post Note  Patient: Dawn Morris  Procedure(s) Performed: ACHILLES TENDON REPAIR (Right Foot)  Patient location during evaluation: PACU Anesthesia Type: General Level of consciousness: awake and alert Pain management: pain level controlled Vital Signs Assessment: post-procedure vital signs reviewed and stable Respiratory status: spontaneous breathing, nonlabored ventilation, respiratory function stable and patient connected to nasal cannula oxygen Cardiovascular status: blood pressure returned to baseline and stable Postop Assessment: no apparent nausea or vomiting Anesthetic complications: no     Last Vitals:  Vitals:   12/06/17 1507 12/06/17 1518  BP:  130/80  Pulse: 79 68  Resp: 15 (!) 6  Temp: 36.6 C (!) 35.8 C  SpO2: 100% 98%    Last Pain:  Vitals:   12/06/17 1518  TempSrc: Temporal  PainSc: 7                  Precious Haws Jakub Debold

## 2017-12-06 NOTE — H&P (Signed)
THE PATIENT WAS SEEN PRIOR TO SURGERY TODAY.  HISTORY, ALLERGIES, HOME MEDICATIONS AND OPERATIVE PROCEDURE WERE REVIEWED. RISKS AND BENEFITS OF SURGERY DISCUSSED WITH PATIENT AGAIN.  NO CHANGES FROM INITIAL HISTORY AND PHYSICAL NOTED.    

## 2017-12-06 NOTE — Anesthesia Procedure Notes (Signed)
Procedure Name: Intubation Date/Time: 12/06/2017 12:59 PM Performed by: Andria Frames, MD Pre-anesthesia Checklist: Patient identified, Emergency Drugs available, Suction available, Patient being monitored and Timeout performed Patient Re-evaluated:Patient Re-evaluated prior to induction Oxygen Delivery Method: Circle system utilized Preoxygenation: Pre-oxygenation with 100% oxygen Induction Type: IV induction Ventilation: Mask ventilation without difficulty Laryngoscope Size: Mac and 3 Grade View: Grade III Tube type: Oral Tube size: 7.0 mm Number of attempts: 1 Airway Equipment and Method: Stylet Placement Confirmation: ETT inserted through vocal cords under direct vision,  positive ETCO2 and breath sounds checked- equal and bilateral Secured at: 22 cm Tube secured with: Tape Dental Injury: Teeth and Oropharynx as per pre-operative assessment  Difficulty Due To: Difficult Airway- due to anterior larynx and Difficulty was anticipated Future Recommendations: Recommend- induction with short-acting agent, and alternative techniques readily available

## 2017-12-06 NOTE — Discharge Instructions (Signed)

## 2017-12-06 NOTE — Progress Notes (Signed)
Right leg elevated on pillows   Toes warm and dry

## 2017-12-06 NOTE — Op Note (Signed)
12/06/2017  2:19 PM  PATIENT:  Dawn Morris    PRE-OPERATIVE DIAGNOSIS:  rupture of achilles tendon right  POST-OPERATIVE DIAGNOSIS:  Same  PROCEDURE:  ACHILLES TENDON REPAIR RIGHT  SURGEON:  Park Breed, MD  ANESTHESIA:   General, prone  PREOPERATIVE INDICATIONS:  Dawn Morris is a  27 y.o. female with a diagnosis of rupture of achilles tendon who was diagnosed with a complete Achilles tendon rupture and elected for surgical management.    The risks benefits and alternatives were discussed with the patient preoperatively including but not limited to the risks of infection, bleeding, nerve injury, cardiopulmonary complications, the need for revision surgery, among others, and the patient was willing to proceed.  EBL: None  TOURNIQUET TIME: 43  MIN  OPERATIVE IMPLANTS: None  OPERATIVE FINDINGS: Complete rupture of Achilles tendon 2 cm above insertion   OPERATIVE PROCEDURE: The patient was brought to the operating room and underwent general endotracheal anesthesia.  The patient was then turned into the prone position on the operating room table and padded appropriately.  The operative leg was prepped and draped in a sterile fashion and an Esmarch bandage applied.  Tourniquet was inflated to 300 mmHg.  A posterior lateral incision was made along the distal aspect of the Achilles tendon.  Dissection was carried out bluntly through subcutaneous tissue, preserving veins and small sensory nerves.  The tendon sheath was then incised longitudinally after freeing it up from adhesions.  As much sheath as possible was preserved.  The Achilles tendon was seen to be completely ruptured.  The wound was irrigated free of debris and blood.  The tendon ends were debrided with scissors to remove frayed tissue.  Once this was accomplished, the tendon ends were reapproximated using a #2 Orthocord suture in a modified Lister fashion, which was tied securely.  This provided good continuity.   The tendon ends were then sutured further with the same suture.  After further irrigation, the tendon sheath was closed with 3-0 Vicryl suture. Subcutaneous tissue was closed with 3-0 vicryl. The skin was closed with staples.  Half percent Sensorcaine was placed into the wound.  A dry sterile dressing with a well-padded short-leg cast was applied.  The ankle was allowed to drop into a comfortable amount of equinus.  Tourniquet was deflated with good return of blood flow to the foot.  Sponge and needle counts were correct.  Patient was awakened and returned to the supine position on a stretcher and taken to recovery in good condition.    Park Breed, MD

## 2017-12-06 NOTE — Transfer of Care (Signed)
Immediate Anesthesia Transfer of Care Note  Patient: Dawn Morris  Procedure(s) Performed: ACHILLES TENDON REPAIR (Right Foot)  Patient Location: PACU  Anesthesia Type:General  Level of Consciousness: awake, alert  and oriented  Airway & Oxygen Therapy: Patient Spontanous Breathing and Patient connected to face mask oxygen  Post-op Assessment: Report given to RN and Post -op Vital signs reviewed and stable  Post vital signs: Reviewed and stable  Last Vitals:  Vitals Value Taken Time  BP 116/77 12/06/2017  2:27 PM  Temp    Pulse 93 12/06/2017  2:29 PM  Resp 22 12/06/2017  2:29 PM  SpO2 100 % 12/06/2017  2:29 PM  Vitals shown include unvalidated device data.  Last Pain:  Vitals:   12/06/17 1053  TempSrc: Oral  PainSc: 0-No pain         Complications: No apparent anesthesia complications

## 2017-12-07 ENCOUNTER — Encounter: Payer: Self-pay | Admitting: Specialist

## 2018-02-05 ENCOUNTER — Other Ambulatory Visit: Payer: Self-pay

## 2018-02-05 MED ORDER — NORGESTIMATE-ETH ESTRADIOL 0.25-35 MG-MCG PO TABS: 1.0000 | ORAL_TABLET | Freq: Every day | ORAL | 0 refills | Status: DC

## 2018-02-27 ENCOUNTER — Other Ambulatory Visit: Payer: Self-pay

## 2018-02-27 MED ORDER — NORGESTIMATE-ETH ESTRADIOL 0.25-35 MG-MCG PO TABS: 1.0000 | ORAL_TABLET | Freq: Every day | ORAL | 3 refills | Status: DC

## 2018-03-22 ENCOUNTER — Other Ambulatory Visit: Payer: Self-pay

## 2018-03-22 MED ORDER — NORGESTIMATE-ETH ESTRADIOL 0.25-35 MG-MCG PO TABS: 1.0000 | ORAL_TABLET | Freq: Every day | ORAL | 3 refills | Status: DC

## 2018-06-01 ENCOUNTER — Other Ambulatory Visit: Payer: Self-pay

## 2018-06-01 MED ORDER — NORGESTIMATE-ETH ESTRADIOL 0.25-35 MG-MCG PO TABS: 1.0000 | ORAL_TABLET | Freq: Every day | ORAL | 3 refills | Status: DC

## 2018-06-09 ENCOUNTER — Emergency Department: Payer: Self-pay

## 2018-06-09 ENCOUNTER — Encounter: Payer: Self-pay | Admitting: Emergency Medicine

## 2018-06-09 ENCOUNTER — Emergency Department
Admission: EM | Admit: 2018-06-09 | Discharge: 2018-06-09 | Disposition: A | Discharge status: Home / Self Care | Payer: Self-pay | Attending: Emergency Medicine | Admitting: Emergency Medicine

## 2018-06-09 DIAGNOSIS — Z7982 Long term (current) use of aspirin: Secondary | ICD-10-CM | POA: Insufficient documentation

## 2018-06-09 DIAGNOSIS — N938 Other specified abnormal uterine and vaginal bleeding: Secondary | ICD-10-CM | POA: Insufficient documentation

## 2018-06-09 DIAGNOSIS — Z79899 Other long term (current) drug therapy: Secondary | ICD-10-CM | POA: Insufficient documentation

## 2018-06-09 DIAGNOSIS — N939 Abnormal uterine and vaginal bleeding, unspecified: Secondary | ICD-10-CM

## 2018-06-09 DIAGNOSIS — R102 Pelvic and perineal pain: Secondary | ICD-10-CM

## 2018-06-09 LAB — CBC
HCT: 36.5 % (ref 36.0–46.0)
Hemoglobin: 12.1 g/dL (ref 12.0–15.0)
MCH: 29.5 pg (ref 26.0–34.0)
MCHC: 33.2 g/dL (ref 30.0–36.0)
MCV: 89 fL (ref 80.0–100.0)
NRBC: 0 % (ref 0.0–0.2)
Platelets: 327 10*3/uL (ref 150–400)
RBC: 4.1 MIL/uL (ref 3.87–5.11)
RDW: 13.1 % (ref 11.5–15.5)
WBC: 11.3 10*3/uL — AB (ref 4.0–10.5)

## 2018-06-09 LAB — COMPREHENSIVE METABOLIC PANEL
ALT: 21 U/L (ref 0–44)
ANION GAP: 10 (ref 5–15)
AST: 21 U/L (ref 15–41)
Albumin: 4.5 g/dL (ref 3.5–5.0)
Alkaline Phosphatase: 48 U/L (ref 38–126)
BUN: 10 mg/dL (ref 6–20)
CO2: 20 mmol/L — ABNORMAL LOW (ref 22–32)
CREATININE: 0.75 mg/dL (ref 0.44–1.00)
Calcium: 9 mg/dL (ref 8.9–10.3)
Chloride: 105 mmol/L (ref 98–111)
Glucose, Bld: 83 mg/dL (ref 70–99)
Potassium: 3.5 mmol/L (ref 3.5–5.1)
Sodium: 135 mmol/L (ref 135–145)
Total Bilirubin: 0.5 mg/dL (ref 0.3–1.2)
Total Protein: 7.1 g/dL (ref 6.5–8.1)

## 2018-06-09 LAB — POCT PREGNANCY, URINE: Preg Test, Ur: NEGATIVE

## 2018-06-09 NOTE — ED Notes (Addendum)
Pt reports she has gone through 4 pads since she has arrived to ED. Pt states she is also feeling dizzy. [employer] updating vital signs and moving pt to subwait

## 2018-06-09 NOTE — ED Triage Notes (Signed)
Pt to ED with sudden onset vaginal bleeding that started around 2 pm. Pt states new birth control in Oct and has abnormal bleeding since.

## 2018-06-09 NOTE — ED Provider Notes (Signed)
[facility] Emergency Department Provider Note  Time seen: 7:45 PM  I have reviewed the triage vital signs and the nursing notes.   HISTORY  Chief Complaint Vaginal Bleeding    HPI Dawn Morris is a 28 y.o. female with no significant past medical history presents to the emergency department for vaginal bleeding.  According to the patient since starting a new birth control in October she has had intermittent vaginal bleeding, states around 2 PM today she had heavy vaginal bleeding changing up to several pads per hour along with large clots and development of right lower quadrant abdominal pain.  Patient denies any vaginal discharge.  Denies any fever.  No nausea vomiting or diarrhea.   Past Medical History:  Diagnosis Date  . Echogenic intracardiac focus of fetus on prenatal ultrasound    09/10/2014  . Increased BMI   . Low-lying placenta   . Melanoma [facility])     Patient Active Problem List   Diagnosis Date Noted  . Encounter for surveillance of contraceptive pills 09/07/2017  . Family history of endometriosis 05/04/2017  . Pelvic pain 05/04/2017  . Spontaneous abortion 11/24/2016  . Left lower quadrant pain 11/22/2016  . SVD (spontaneous vaginal delivery) 01/31/2015  . 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    Past Surgical History:  Procedure Laterality Date  . ACHILLES TENDON SURGERY Right 12/06/2017   Procedure: ACHILLES TENDON REPAIR;  Surgeon: Earnestine Leys, MD;  Location: [facility] ORS;  Service: Orthopedics;  Laterality: Right;  . MELANOMA EXCISION    . TONSILLECTOMY  2005    Prior to Admission medications   Medication Sig Start Date End Date Taking? Authorizing Provider  acetaminophen (TYLENOL) 325 MG tablet Take 650 mg by mouth every 6 (six) hours as needed for moderate pain.    [provider]  aspirin EC 325 MG tablet Take 1 tablet (325 mg total) by mouth 2 (two) times daily. 12/06/17   Earnestine Leys, MD  gabapentin (NEURONTIN) 400  MG capsule Take 1 capsule (400 mg total) by mouth 3 (three) times daily. 12/06/17   Earnestine Leys, MD  HYDROcodone-acetaminophen (NORCO) 7.5-325 MG tablet Take 1 tablet by mouth every 6 (six) hours as needed for moderate pain. 12/06/17   Earnestine Leys, MD  meloxicam (MOBIC) 15 MG tablet Take 15 mg by mouth daily.    [provider]  norgestimate-ethinyl estradiol (ORTHO-CYCLEN,SPRINTEC,PREVIFEM) 0.25-35 MG-MCG tablet Take 1 tablet by mouth daily. 06/01/18   Defrancesco, Alanda Slim, MD  oxyCODONE-acetaminophen (PERCOCET/ROXICET) 5-325 MG tablet Take by mouth every 4 (four) hours as needed for severe pain.    [provider]    No Known Allergies  Family History  Problem Relation Age of Onset  . Cancer Neg Hx   . Diabetes Neg Hx   . Heart disease Neg Hx     Social History Social History   Tobacco Use  . Smoking status: Never Smoker  . Smokeless tobacco: Never Used  Substance Use Topics  . Alcohol use: No  . Drug use: No    Review of Systems Constitutional: Negative for fever. Cardiovascular: Negative for chest pain. Respiratory: Negative for shortness of breath. Gastrointestinal: Right lower abdominal pain. Genitourinary: Positive for vaginal bleeding. Musculoskeletal: Negative for musculoskeletal complaints Skin: Negative for skin complaints  Neurological: Negative for headache All other ROS negative  ____________________________________________   PHYSICAL EXAM:  VITAL SIGNS: ED Triage Vitals  Enc Vitals Group     BP 06/09/18 1632 (!) 136/91     Pulse Rate 06/09/18  1632 74     Resp 06/09/18 1632 18     Temp 06/09/18 1632 97.7 F (36.5 C)     Temp Source 06/09/18 1632 Oral     SpO2 06/09/18 1632 97 %     Weight 06/09/18 1636 160 lb (72.6 kg)     Height 06/09/18 1636 5\' 7"  (1.702 m)     Head Circumference --      Peak Flow --      Pain Score 06/09/18 1635 8     Pain Loc --      Pain Edu? --      Excl. in [place]? --     Constitutional: Alert and  oriented. Well appearing and in no distress. Eyes: Normal exam ENT   Head: Normocephalic and atraumatic   Mouth/Throat: Mucous membranes are moist. Cardiovascular: Normal rate, regular rhythm. Respiratory: Normal respiratory effort without tachypnea nor retractions. Breath sounds are clear  Gastrointestinal: Soft, mild tenderness in the very low right abdomen/right pelvis.  No tenderness over McBurney's point. Musculoskeletal: Nontender with normal range of motion in all extremities. Neurologic:  Normal speech and language. No gross focal neurologic deficits  Skin:  Skin is warm, dry and intact.  Psychiatric: Mood and affect are normal.   ____________________________________________     RADIOLOGY  Ultrasound pending  ____________________________________________   INITIAL IMPRESSION / ASSESSMENT AND PLAN / ED COURSE  Pertinent labs & imaging results that were available during my care of the patient were reviewed by me and considered in my medical decision making (see chart for details).  Patient presents to the emergency department for right lower abdominal pain and development of vaginal bleeding.  States she has been experiencing intermittent vaginal bleeding since October when she started Ortho Tri-Cyclen birth control.  However she states today around 2:00 she began with heavy vaginal bleeding and clot.  Patient states she has had intermittent vaginal bleeding in the past but never this heavy.  Differential this time would include abnormal uterine bleeding/dysfunctional uterine bleeding, breakthrough bleeding, miscarriage, ovarian or hemorrhagic cyst.  Patient's labs show a negative pregnancy test, largely normal lab work including normal blood counts.  Pelvic exam shows mild right adnexal tenderness palpation, no cervical motion or left adnexal tenderness.  Minimal vaginal bleeding at this time.  No cervical clots.  We will obtain an ultrasound to further evaluate.  Patient  care signed out to Dr. Joni Fears, ultrasound pending.   ____________________________________________   FINAL CLINICAL IMPRESSION(S) / ED DIAGNOSES  Vaginal bleeding   Harvest Dark, MD 06/09/18 1952

## 2018-06-09 NOTE — Discharge Instructions (Addendum)
Your ultrasound today was normal.  Please follow-up with your OB/GYN as soon as possible for recheck/reevaluation.  Return to the emergency department for any significant increase in bleeding, significant increase in pain, or any other symptom personally concerning to yourself.

## 2018-06-09 NOTE — ED Notes (Signed)
Patient transported to Ultrasound 

## 2018-06-11 ENCOUNTER — Other Ambulatory Visit (HOSPITAL_COMMUNITY)
Admission: RE | Admit: 2018-06-11 | Discharge: 2018-06-11 | Disposition: A | Discharge status: Home / Self Care | Payer: Self-pay | Source: Ambulatory Visit | Attending: Certified Nurse Midwife | Admitting: Certified Nurse Midwife

## 2018-06-11 ENCOUNTER — Ambulatory Visit: Payer: Self-pay | Admitting: Certified Nurse Midwife

## 2018-06-11 ENCOUNTER — Encounter: Payer: Self-pay | Admitting: Certified Nurse Midwife

## 2018-06-11 VITALS — BP 125/89 | HR 71 | Ht 66.0 in | Wt 174.2 lb

## 2018-06-11 DIAGNOSIS — Z842 Family history of other diseases of the genitourinary system: Secondary | ICD-10-CM

## 2018-06-11 DIAGNOSIS — R102 Pelvic and perineal pain: Secondary | ICD-10-CM | POA: Insufficient documentation

## 2018-06-11 DIAGNOSIS — N939 Abnormal uterine and vaginal bleeding, unspecified: Secondary | ICD-10-CM

## 2018-06-11 DIAGNOSIS — Z8349 Family history of other endocrine, nutritional and metabolic diseases: Secondary | ICD-10-CM | POA: Insufficient documentation

## 2018-06-11 NOTE — Progress Notes (Signed)
GYN ENCOUNTER NOTE  Subjective:       Dawn Morris is a 28 y.o. G36P1011 female here for gynecologic evaluation of the following issues:  1. Abnormal uterine bleeding   Reports excessive vaginal bleeding with quarter sized clots and intermittent pelvic pain since Saturday, 06/09/2018. Seen at [facility] ER for symptoms with normal CBC and ultrasound.   Tracking cycle using Gl[employer] App and has noticed changes over the last two (2) months. No change in birth control pill or use of generic.   Endorses weakness and fatigue for last month. Family history significant for endometriosis and hashimoto's thyroiditis.   Denies difficulty breathing or respiratory distress, chest pain, dysuria, and leg pain or swelling.    Gynecologic History  Patient's last menstrual period was 05/28/2018 (exact date). Period Pattern: (!) Irregular Menstrual Flow: Heavy Menstrual Control: Maxi pad Dysmenorrhea: (!) Severe Dysmenorrhea Symptoms: Cramping  Contraception: OCP (estrogen/progesterone)  Last Pap: 08/2017. Results were: normal  Obstetric History  OB History  Gravida Para Term Preterm AB Living  2 1 1   1 1   SAB TAB Ectopic Multiple Live Births  1     0 1    # Outcome Date GA Lbr Len/2nd Weight Sex Delivery Anes PTL Lv  2 SAB 2018          1 Term 01/30/15 [redacted]w[redacted]d 11:52 / 01:14 8 lb 0.4 oz (3.64 kg) F Vag-Spont EPI  LIV     Birth Comments: none noted    Past Medical History:  Diagnosis Date  . Echogenic intracardiac focus of fetus on prenatal ultrasound    09/10/2014  . Increased BMI   . Low-lying placenta   . Melanoma [facility])     Past Surgical History:  Procedure Laterality Date  . ACHILLES TENDON SURGERY Right 12/06/2017   Procedure: ACHILLES TENDON REPAIR;  Surgeon: Earnestine Leys, MD;  Location: [facility] ORS;  Service: Orthopedics;  Laterality: Right;  . MELANOMA EXCISION    . TONSILLECTOMY  2005    Current Outpatient Medications on File Prior to Visit  Medication Sig Dispense Refill   . acetaminophen (TYLENOL) 325 MG tablet Take 650 mg by mouth every 6 (six) hours as needed for moderate pain.    Marland Kitchen aspirin EC 325 MG tablet Take 1 tablet (325 mg total) by mouth 2 (two) times daily. 30 tablet 0  . gabapentin (NEURONTIN) 400 MG capsule Take 1 capsule (400 mg total) by mouth 3 (three) times daily. 60 capsule 3  . HYDROcodone-acetaminophen (NORCO) 7.5-325 MG tablet Take 1 tablet by mouth every 6 (six) hours as needed for moderate pain. 50 tablet 0  . meloxicam (MOBIC) 15 MG tablet Take 15 mg by mouth daily.    . norgestimate-ethinyl estradiol (ORTHO-CYCLEN,SPRINTEC,PREVIFEM) 0.25-35 MG-MCG tablet Take 1 tablet by mouth daily. 3 Package 3   No current facility-administered medications on file prior to visit.     No Known Allergies  Social History   Socioeconomic History  . Marital status: Single    Spouse name: Not on file  . Number of children: Not on file  . Years of education: Not on file  . Highest education level: Not on file  Occupational History  . Not on file  Social Needs  . Financial resource strain: Not on file  . Food insecurity:    Worry: Not on file    Inability: Not on file  . Transportation needs:    Medical: Not on file    Non-medical: Not on file  Tobacco Use  .  Smoking status: Never Smoker  . Smokeless tobacco: Never Used  Substance and Sexual Activity  . Alcohol use: No  . Drug use: No  . Sexual activity: Yes    Birth control/protection: Pill  Lifestyle  . Physical activity:    Days per week: 3 days    Minutes per session: 30 min  . Stress: Not on file  Relationships  . Social connections:    Talks on phone: Not on file    Gets together: Not on file    Attends religious service: Not on file    Active member of club or organization: Not on file    Attends meetings of clubs or organizations: Not on file    Relationship status: Not on file  . Intimate partner violence:    Fear of current or ex partner: Not on file    Emotionally  abused: Not on file    Physically abused: Not on file    Forced sexual activity: Not on file  Other Topics Concern  . Not on file  Social History Narrative  . Not on file    Family History  Problem Relation Age of Onset  . Cancer Neg Hx   . Diabetes Neg Hx   . Heart disease Neg Hx   . Breast cancer Neg Hx   . Ovarian cancer Neg Hx   . Colon cancer Neg Hx     The following portions of the patient's history were reviewed and updated as appropriate: allergies, current medications, past family history, past medical history, past social history, past surgical history and problem list.  Review of Systems  ROS negative except as noted above. Information obtained from patient.   Objective:   BP 125/89   Pulse 71   Ht 5\' 6"  (1.676 m)   Wt 174 lb 3.2 oz (79 kg)   LMP 05/28/2018 (Exact Date)   BMI 28.12 kg/m    CONSTITUTIONAL: Well-developed, well-nourished female in no acute distress.   ABDOMEN: Soft, non distended; Non tender.  No Organomegaly.  PELVIC:  External Genitalia: Normal  Vagina: Bleeding present  Cervix: Normal  Uterus: Normal size, shape,consistency, mobile  Adnexa: Normal  MUSCULOSKELETAL: Normal range of motion. No tenderness.  No cyanosis, clubbing, or edema.  TRANSABDOMINAL AND TRANSVAGINAL ULTRASOUND OF PELVIS (06/09/2018 2053)  DOPPLER ULTRASOUND OF OVARIES  TECHNIQUE: Both transabdominal and transvaginal ultrasound examinations of the pelvis were performed. Transabdominal technique was performed for global imaging of the pelvis including uterus, ovaries, adnexal regions, and pelvic cul-de-sac.  It was necessary to proceed with endovaginal exam following the transabdominal exam to visualize the right adnexa. Color and duplex Doppler ultrasound was utilized to evaluate blood flow to the ovaries.  COMPARISON:  None.  FINDINGS: Uterus  Measurements: 8.8 x 3.5 x 5.1 cm = volume: 81.8 mL. No fibroids or other mass  visualized.  Endometrium  Thickness: 1.0 mm.  No focal abnormality visualized.  Right ovary  Measurements: 3.4 x 1.7 x 1.9 cm = volume: 5.4 mL. Normal appearance/no adnexal masses.  Left ovary  Measurements: 3.1 x 1.5 x 1.9 cm = volume: 4.3 mL. Normal appearance/no adnexal mass.  Pulsed Doppler evaluation of both ovaries demonstrates normal low-resistance arterial and venous waveforms.  Other findings  No abnormal free fluid.  IMPRESSION: Normal pelvic ultrasound.  LABS:  Recent Results (from the past 2160 hour(s))  Comprehensive metabolic panel     Status: Abnormal   Collection Time: 06/09/18  4:43 PM  Result Value Ref Range   Sodium  135 135 - 145 mmol/L   Potassium 3.5 3.5 - 5.1 mmol/L   Chloride 105 98 - 111 mmol/L   CO2 20 (L) 22 - 32 mmol/L   Glucose, Bld 83 70 - 99 mg/dL   BUN 10 6 - 20 mg/dL   Creatinine, Ser 0.75 0.44 - 1.00 mg/dL   Calcium 9.0 8.9 - 10.3 mg/dL   Total Protein 7.1 6.5 - 8.1 g/dL   Albumin 4.5 3.5 - 5.0 g/dL   AST 21 15 - 41 U/L   ALT 21 0 - 44 U/L   Alkaline Phosphatase 48 38 - 126 U/L   Total Bilirubin 0.5 0.3 - 1.2 mg/dL   GFR calc non Af Amer >60 >60 mL/min   GFR calc Af Amer >60 >60 mL/min   Anion gap 10 5 - 15    Comment: Performed at [facility], [address] [place], [place] [address]  CBC     Status: Abnormal   Collection Time: 06/09/18  4:43 PM  Result Value Ref Range   WBC 11.3 (H) 4.0 - 10.5 K/uL   RBC 4.10 3.87 - 5.11 MIL/uL   Hemoglobin 12.1 12.0 - 15.0 g/dL   HCT 36.5 36.0 - 46.0 %   MCV 89.0 80.0 - 100.0 fL   MCH 29.5 26.0 - 34.0 pg   MCHC 33.2 30.0 - 36.0 g/dL   RDW 13.1 11.5 - 15.5 %   Platelets 327 150 - 400 K/uL   nRBC 0.0 0.0 - 0.2 %    Comment: Performed at [facility], [address] [place], [place] [address]  Pregnancy, urine POC     Status: None   Collection Time: 06/09/18  4:48 PM  Result Value Ref Range   Preg Test, Ur NEGATIVE NEGATIVE    Comment:        THE  SENSITIVITY OF THIS METHODOLOGY IS >24 mIU/mL      Assessment:   1. Abnormal uterine bleeding  - Thyroid Panel With TSH - FSH/LH - Beta HCG, Quant  2. Family history of Hashimoto thyroiditis  3. Family history of endometriosis  4. Pelvic pain  Plan:   Labs: see orders; will contact patient with results.   Discussed treatment options including possible IUD placement in the future.   Will proceed with COC taper for the next 10 days; Taytulla samples given.   Reviewed red flag symptoms and when to call.   RTC as needed.    Diona Fanti, CNM [facility], [facility] 06/11/18 9:51 AM

## 2018-06-11 NOTE — Patient Instructions (Addendum)
Levonorgestrel intrauterine device (IUD) What is this medicine? LEVONORGESTREL IUD (LEE voe nor jes trel) is a contraceptive (birth control) device. The device is placed inside the uterus by a healthcare professional. It is used to prevent pregnancy. This device can also be used to treat heavy bleeding that occurs during your period. This medicine may be used for other purposes; ask your health care provider or pharmacist if you have questions. COMMON BRAND NAME(S): Kyleena, LILETTA, Mirena, Skyla What should I tell my health care provider before I take this medicine? They need to know if you have any of these conditions: -abnormal Pap smear -cancer of the breast, uterus, or cervix -diabetes -endometritis -genital or pelvic infection now or in the past -have more than one sexual partner or your partner has more than one partner -heart disease -history of an ectopic or tubal pregnancy -immune system problems -IUD in place -liver disease or tumor -problems with blood clots or take blood-thinners -seizures -use intravenous drugs -uterus of unusual shape -vaginal bleeding that has not been explained -an unusual or allergic reaction to levonorgestrel, other hormones, silicone, or polyethylene, medicines, foods, dyes, or preservatives -pregnant or trying to get pregnant -breast-feeding How should I use this medicine? This device is placed inside the uterus by a health care professional. Talk to your pediatrician regarding the use of this medicine in children. Special care may be needed. Overdosage: If you think you have taken too much of this medicine contact a poison control center or emergency room at once. NOTE: This medicine is only for you. Do not share this medicine with others. What if I miss a dose? This does not apply. Depending on the brand of device you have inserted, the device will need to be replaced every 3 to 5 years if you wish to continue using this type of birth  control. What may interact with this medicine? Do not take this medicine with any of the following medications: -amprenavir -bosentan -fosamprenavir This medicine may also interact with the following medications: -aprepitant -armodafinil -barbiturate medicines for inducing sleep or treating seizures -bexarotene -boceprevir -griseofulvin -medicines to treat seizures like carbamazepine, ethotoin, felbamate, oxcarbazepine, phenytoin, topiramate -modafinil -pioglitazone -rifabutin -rifampin -rifapentine -some medicines to treat HIV infection like atazanavir, efavirenz, indinavir, lopinavir, nelfinavir, tipranavir, ritonavir -St. John's wort -warfarin This list may not describe all possible interactions. Give your health care provider a list of all the medicines, herbs, non-prescription drugs, or dietary supplements you use. Also tell them if you smoke, drink alcohol, or use illegal drugs. Some items may interact with your medicine. What should I watch for while using this medicine? Visit your doctor or health care professional for regular check ups. See your doctor if you or your partner has sexual contact with others, becomes HIV positive, or gets a sexual transmitted disease. This product does not protect you against HIV infection (AIDS) or other sexually transmitted diseases. You can check the placement of the IUD yourself by reaching up to the top of your vagina with clean fingers to feel the threads. Do not pull on the threads. It is a good habit to check placement after each menstrual period. Call your doctor right away if you feel more of the IUD than just the threads or if you cannot feel the threads at all. The IUD may come out by itself. You may become pregnant if the device comes out. If you notice that the IUD has come out use a backup birth control method like condoms and call your   health care provider. Using tampons will not change the position of the IUD and are okay to use  during your period. This IUD can be safely scanned with magnetic resonance imaging (MRI) only under specific conditions. Before you have an MRI, tell your healthcare provider that you have an IUD in place, and which type of IUD you have in place. What side effects may I notice from receiving this medicine? Side effects that you should report to your doctor or health care professional as soon as possible: -allergic reactions like skin rash, itching or hives, swelling of the face, lips, or tongue -fever, flu-like symptoms -genital sores -high blood pressure -no menstrual period for 6 weeks during use -pain, swelling, warmth in the leg -pelvic pain or tenderness -severe or sudden headache -signs of pregnancy -stomach cramping -sudden shortness of breath -trouble with balance, talking, or walking -unusual vaginal bleeding, discharge -yellowing of the eyes or skin Side effects that usually do not require medical attention (report to your doctor or health care professional if they continue or are bothersome): -acne -breast pain -change in sex drive or performance -changes in weight -cramping, dizziness, or faintness while the device is being inserted -headache -irregular menstrual bleeding within first 3 to 6 months of use -nausea This list may not describe all possible side effects. Call your doctor for medical advice about side effects. You may report side effects to FDA at 1-800-FDA-1088. Where should I keep my medicine? This does not apply. NOTE: This sheet is a summary. It may not cover all possible information. If you have questions about this medicine, talk to your doctor, pharmacist, or health care provider.  2019 Elsevier/Gold Standard (2016-02-12 14:14:56) Hypothyroidism  Hypothyroidism is when the thyroid gland does not make enough of certain hormones (it is underactive). The thyroid gland is a small gland located in the lower front part of the neck, just in front of the  windpipe (trachea). This gland makes hormones that help control how the body uses food for energy (metabolism) as well as how the heart and brain function. These hormones also play a role in keeping your bones strong. When the thyroid is underactive, it produces too little of the hormones thyroxine (T4) and triiodothyronine (T3). What are the causes? This condition may be caused by:  Hashimoto's disease. This is a disease in which the body's disease-fighting system (immune system) attacks the thyroid gland. This is the most common cause.  Viral infections.  Pregnancy.  Certain medicines.  Birth defects.  Past radiation treatments to the head or neck for cancer.  Past treatment with radioactive iodine.  Past exposure to radiation in the environment.  Past surgical removal of part or all of the thyroid.  Problems with a gland in the center of the brain (pituitary gland).  Lack of enough iodine in the diet. What increases the risk? You are more likely to develop this condition if:  You are female.  You have a family history of thyroid conditions.  You use a medicine called lithium.  You take medicines that affect the immune system (immunosuppressants). What are the signs or symptoms? Symptoms of this condition include:  Feeling as though you have no energy (lethargy).  Not being able to tolerate cold.  Weight gain that is not explained by a change in diet or exercise habits.  Lack of appetite.  Dry skin.  Coarse hair.  Menstrual irregularity.  Slowing of thought processes.  Constipation.  Sadness or depression. How is this diagnosed? This  condition may be diagnosed based on:  Your symptoms, your medical history, and a physical exam.  Blood tests. You may also have imaging tests, such as an ultrasound or MRI. How is this treated? This condition is treated with medicine that replaces the thyroid hormones that your body does not make. After you begin  treatment, it may take several weeks for symptoms to go away. Follow these instructions at home:  Take over-the-counter and prescription medicines only as told by your health care provider.  If you start taking any new medicines, tell your health care provider.  Keep all follow-up visits as told by your health care provider. This is important. ? As your condition improves, your dosage of thyroid hormone medicine may change. ? You will need to have blood tests regularly so that your health care provider can monitor your condition. Contact a health care provider if:  Your symptoms do not get better with treatment.  You are taking thyroid replacement medicine and you: ? Sweat a lot. ? Have tremors. ? Feel anxious. ? Lose weight rapidly. ? Cannot tolerate heat. ? Have emotional swings. ? Have diarrhea. ? Feel weak. Get help right away if you have:  Chest pain.  An irregular heartbeat.  A rapid heartbeat.  Difficulty breathing. Summary  Hypothyroidism is when the thyroid gland does not make enough of certain hormones (it is underactive).  When the thyroid is underactive, it produces too little of the hormones thyroxine (T4) and triiodothyronine (T3).  The most common cause is Hashimoto's disease, a disease in which the body's disease-fighting system (immune system) attacks the thyroid gland. The condition can also be caused by viral infections, medicine, pregnancy, or past radiation treatment to the head or neck.  Symptoms may include weight gain, dry skin, constipation, feeling as though you do not have energy, and not being able to tolerate cold.  This condition is treated with medicine to replace the thyroid hormones that your body does not make. This information is not intended to replace advice given to you by your health care provider. Make sure you discuss any questions you have with your health care provider. Document Released: 05/02/2005 Document Revised: 04/12/2017  Document Reviewed: 04/12/2017 Elsevier Interactive Patient Education  [employer]. Endometriosis  Endometriosis is a condition in which the tissue that lines the uterus (endometrium) grows outside of its normal location. The tissue may grow in many locations close to the uterus, but it commonly grows on the ovaries, fallopian tubes, vagina, or bowel. When the uterus sheds the endometrium every menstrual cycle, there is bleeding wherever the endometrial tissue is located. This can cause pain because blood is irritating to tissues that are not normally exposed to it. What are the causes? The cause of endometriosis is not known. What increases the risk? You may be more likely to develop endometriosis if you:  Have a family history of endometriosis.  Have never given birth.  Started your period at age 81 or younger.  Have high levels of estrogen in your body.  Were exposed to a certain medicine (diethylstilbestrol) before you were born (in utero).  Had low birth weight.  Were born as a twin, triplet, or other multiple.  Have a BMI of less than 25. BMI is an estimate of body fat and is calculated from height and weight. What are the signs or symptoms? Often, there are no symptoms of this condition. If you do have symptoms, they may:  Vary depending on where your endometrial tissue  is growing.  Occur during your menstrual period (most common) or midcycle.  Come and go, or you may go months with no symptoms at all.  Stop with menopause. Symptoms may include:  Pain in the back or abdomen.  Heavier bleeding during periods.  Pain during sex.  Painful bowel movements.  Infertility.  Pelvic pain.  Bleeding more than once a month. How is this diagnosed? This condition is diagnosed based on your symptoms and a physical exam. You may have tests, such as:  Blood tests and urine tests. These may be done to help rule out other possible causes of your symptoms.  Ultrasound,  to look for abnormal tissues.  An X-ray of the lower bowel (barium enema).  An ultrasound that is done through the vagina (transvaginally).  CT scan.  MRI.  Laparoscopy. In this procedure, a lighted, pencil-sized instrument called a laparoscope is inserted into your abdomen through an incision. The laparoscope allows your health care provider to look at the organs inside your body and check for abnormal tissue to confirm the diagnosis. If abnormal tissue is found, your health care provider may remove a small piece of tissue (biopsy) to be examined under a microscope. How is this treated? Treatment for this condition may include:  Medicines to relieve pain, such as NSAIDs.  Hormone therapy. This involves using artificial (synthetic) hormones to reduce endometrial tissue growth. Your health care provider may recommend using a hormonal form of birth control, or other medicines.  Surgery. This may be done to remove abnormal endometrial tissue. ? In some cases, tissue may be removed using a laparoscope and a laser (laparoscopic laser treatment). ? In severe cases, surgery may be done to remove the fallopian tubes, uterus, and ovaries (hysterectomy). Follow these instructions at home:  Take over-the-counter and prescription medicines only as told by your health care provider.  Do not drive or use heavy machinery while taking prescription pain medicine.  Try to avoid activities that cause pain, including sexual activity.  Keep all follow-up visits as told by your health care provider. This is important. Contact a health care provider if:  You have pain in the area between your hip bones (pelvic area) that occurs: ? Before, during, or after your period. ? In between your period and gets worse during your period. ? During or after sex. ? With bowel movements or urination, especially during your period.  You have problems getting pregnant.  You have a fever. Get help right away  if:  You have severe pain that does not get better with medicine.  You have severe nausea and vomiting, or you cannot eat without vomiting.  You have pain that affects only the lower, right side of your abdomen.  You have abdominal pain that gets worse.  You have abdominal swelling.  You have blood in your stool. This information is not intended to replace advice given to you by your health care provider. Make sure you discuss any questions you have with your health care provider. Document Released: 04/29/2000 Document Revised: 02/05/2016 Document Reviewed: 10/03/2015 Elsevier Interactive Patient Education  2019 Elsevier Inc. Abnormal Uterine Bleeding Abnormal uterine bleeding is unusual bleeding from the uterus. It includes:  Bleeding or spotting between periods.  Bleeding after sex.  Bleeding that is heavier than normal.  Periods that last longer than usual.  Bleeding after menopause. Abnormal uterine bleeding can affect women at various stages in life, including teenagers, women in their reproductive years, pregnant women, and women who have reached  menopause. Common causes of abnormal uterine bleeding include:  Pregnancy.  Growths of tissue (polyps).  A noncancerous tumor in the uterus (fibroid).  Infection.  Cancer.  Hormonal imbalances. Any type of abnormal bleeding should be evaluated by a health care provider. Many cases are minor and simple to treat, while others are more serious. Treatment will depend on the cause of the bleeding. Follow these instructions at home:  Monitor your condition for any changes.  Do not use tampons, douche, or have sex if told by your health care provider.  Change your pads often.  Get regular exams that include pelvic exams and cervical cancer screening.  Keep all follow-up visits as told by your health care provider. This is important. Contact a health care provider if:  Your bleeding lasts for more than one week.  You  feel dizzy at times.  You feel nauseous or you vomit. Get help right away if:  You pass out.  Your bleeding soaks through a pad every hour.  You have abdominal pain.  You have a fever.  You become sweaty or weak.  You pass large blood clots from your vagina. Summary  Abnormal uterine bleeding is unusual bleeding from the uterus.  Any type of abnormal bleeding should be evaluated by a health care provider. Many cases are minor and simple to treat, while others are more serious.  Treatment will depend on the cause of the bleeding. This information is not intended to replace advice given to you by your health care provider. Make sure you discuss any questions you have with your health care provider. Document Released: 05/02/2005 Document Revised: 06/03/2016 Document Reviewed: 06/03/2016 Elsevier Interactive Patient Education  2019 [employer] TO HEAR FROM YOU!!!!   Thank you [name] for visiting [facility].  Providing our patients with the best experience possible is really important to [place], and we hope that you felt that on your recent visit. The most valuable feedback we get comes from [place]!!    If you receive a survey please take a couple of minutes to let us know how we did.Thank you for continuing to trust [place] with your care.   [facility]

## 2018-06-11 NOTE — Progress Notes (Signed)
Patient here today to follow-up after ED visit 1/25, had normal ultrasound and labs.  Patient  c/o heavy vaginal bleeding, pelvic pain and passing clots since Saturday.

## 2018-06-12 LAB — THYROID PANEL WITH TSH
Free Thyroxine Index: 1.9 (ref 1.2–4.9)
T3 Uptake Ratio: 21 % — ABNORMAL LOW (ref 24–39)
T4, Total: 8.9 ug/dL (ref 4.5–12.0)
TSH: 1.34 u[IU]/mL (ref 0.450–4.500)

## 2018-06-12 LAB — CERVICOVAGINAL ANCILLARY ONLY
Bacterial vaginitis: NEGATIVE
Candida vaginitis: POSITIVE — AB

## 2018-06-12 LAB — FSH/LH
FSH: 4.7 m[IU]/mL
LH: 7.4 m[IU]/mL

## 2018-06-12 LAB — BETA HCG QUANT (REF LAB): hCG Quant: <1 m[IU]/mL

## 2018-06-13 ENCOUNTER — Encounter: Payer: Self-pay | Admitting: Certified Nurse Midwife

## 2018-06-13 ENCOUNTER — Other Ambulatory Visit: Payer: Self-pay

## 2018-06-13 MED ORDER — FLUCONAZOLE 150 MG PO TABS: 150.0000 mg | ORAL_TABLET | Freq: Once | ORAL | 0 refills | Status: AC

## 2018-07-06 ENCOUNTER — Encounter: Payer: Self-pay | Admitting: Certified Nurse Midwife

## 2018-08-29 ENCOUNTER — Encounter: Payer: Self-pay | Admitting: Certified Nurse Midwife

## 2018-09-11 ENCOUNTER — Encounter: Payer: Self-pay | Admitting: Obstetrics and Gynecology

## 2018-09-24 ENCOUNTER — Encounter: Payer: Self-pay | Admitting: Certified Nurse Midwife

## 2018-10-17 ENCOUNTER — Telehealth: Payer: Self-pay | Admitting: *Deleted

## 2018-10-17 NOTE — Telephone Encounter (Signed)
Coronavirus (COVID-19) Are you at risk?  Are you at risk for the Coronavirus (COVID-19)?  To be considered HIGH RISK for Coronavirus (COVID-19), you have to meet the following criteria:  . Traveled to [place], [place], [place], [place] or [place]; or in the [place] to [place], [place], [place], or [place]; and have fever, cough, and shortness of breath within the last 2 weeks of travel OR . Been in close contact with a person diagnosed with COVID-19 within the last 2 weeks and have fever, cough, and shortness of breath . IF YOU DO NOT MEET THESE CRITERIA, YOU ARE CONSIDERED LOW RISK FOR COVID-19.  What to do if you are HIGH RISK for COVID-19?  . If you are having a medical emergency, call 911. . Seek medical care right away. Before you go to a doctor's office, urgent care or emergency department, call ahead and tell them about your recent travel, contact with someone diagnosed with COVID-19, and your symptoms. You should receive instructions from your physician's office regarding next steps of care.  . When you arrive at healthcare provider, tell the healthcare staff immediately you have returned from visiting [place], [place], [place], [place] or [place]; or traveled in the [place] to [place], [place], [place], or [place]; in the last two weeks or you have been in close contact with a person diagnosed with COVID-19 in the last 2 weeks.   . Tell the health care staff about your symptoms: fever, cough and shortness of breath. . After you have been seen by a medical provider, you will be either: o Tested for (COVID-19) and discharged home on quarantine except to seek medical care if symptoms worsen, and asked to  - Stay home and avoid contact with others until you get your results (4-5 days)  - Avoid travel on public transportation if possible (such as bus, train, or airplane) or o Sent to the Emergency Department by EMS for evaluation, COVID-19 testing, and possible  admission depending on your condition and test results.  What to do if you are LOW RISK for COVID-19?  Reduce your risk of any infection by using the same precautions used for avoiding the common cold or flu:  . Wash your hands often with soap and warm water for at least 20 seconds.  If soap and water are not readily available, use an alcohol-based hand sanitizer with at least 60% alcohol.  . If coughing or sneezing, cover your mouth and nose by coughing or sneezing into the elbow areas of your shirt or coat, into a tissue or into your sleeve (not your hands). . Avoid shaking hands with others and consider head nods or verbal greetings only. . Avoid touching your eyes, nose, or mouth with unwashed hands.  . Avoid close contact with people who are sick. . Avoid places or events with large numbers of people in one location, like concerts or sporting events. . Carefully consider travel plans you have or are making. . If you are planning any travel outside or inside the [place], visit the CDC's Travelers' Health webpage for the latest health notices. . If you have some symptoms but not all symptoms, continue to monitor at home and seek medical attention if your symptoms worsen. . If you are having a medical emergency, call 911.   ADDITIONAL HEALTHCARE OPTIONS FOR PATIENTS  [facility] / e-Visit: [email or web address]         [facility]: [phone]  [facility]: [phone]                   [facility]: [phone]   Spoke with pt denies any sx.   , CMA 

## 2018-10-18 ENCOUNTER — Encounter: Payer: Self-pay | Admitting: Obstetrics and Gynecology

## 2018-10-18 ENCOUNTER — Ambulatory Visit (INDEPENDENT_AMBULATORY_CARE_PROVIDER_SITE_OTHER): Payer: Medicaid Other | Admitting: Obstetrics and Gynecology

## 2018-10-18 ENCOUNTER — Other Ambulatory Visit: Payer: Self-pay

## 2018-10-18 VITALS — BP 128/84 | HR 98 | Ht 66.0 in | Wt 182.3 lb

## 2018-10-18 DIAGNOSIS — Z6829 Body mass index (BMI) 29.0-29.9, adult: Secondary | ICD-10-CM

## 2018-10-18 DIAGNOSIS — Z3041 Encounter for surveillance of contraceptive pills: Secondary | ICD-10-CM

## 2018-10-18 DIAGNOSIS — Z0000 Encounter for general adult medical examination without abnormal findings: Secondary | ICD-10-CM | POA: Diagnosis not present

## 2018-10-18 DIAGNOSIS — Z01419 Encounter for gynecological examination (general) (routine) without abnormal findings: Secondary | ICD-10-CM | POA: Diagnosis not present

## 2018-10-18 MED ORDER — NORETHIN ACE-ETH ESTRAD-FE 1-20 MG-MCG(24) PO CAPS: 1.0000 | ORAL_CAPSULE | Freq: Every day | ORAL | 0 refills | Status: DC

## 2018-10-18 NOTE — Progress Notes (Signed)
  Subjective:     Dawn Morris is a single white  28 y.o. female and is here for a comprehensive physical exam.working FT from home. Lives with 58 yo daughter.  Sexually active with female partner. Exercising daily with running. The patient reports no problems.  Social History   Socioeconomic History  . Marital status: Single    Spouse name: Not on file  . Number of children: Not on file  . Years of education: Not on file  . Highest education level: Not on file  Occupational History  . Not on file  Social Needs  . Financial resource strain: Not on file  . Food insecurity:    Worry: Not on file    Inability: Not on file  . Transportation needs:    Medical: Not on file    Non-medical: Not on file  Tobacco Use  . Smoking status: Never Smoker  . Smokeless tobacco: Never Used  Substance and Sexual Activity  . Alcohol use: No  . Drug use: No  . Sexual activity: Yes  Lifestyle  . Physical activity:    Days per week: 3 days    Minutes per session: 30 min  . Stress: Not on file  Relationships  . Social connections:    Talks on phone: Not on file    Gets together: Not on file    Attends religious service: Not on file    Active member of club or organization: Not on file    Attends meetings of clubs or organizations: Not on file    Relationship status: Not on file  . Intimate partner violence:    Fear of current or ex partner: Not on file    Emotionally abused: Not on file    Physically abused: Not on file    Forced sexual activity: Not on file  Other Topics Concern  . Not on file  Social History Narrative  . Not on file   Health Maintenance  Topic Date Due  . HIV Screening  12/31/2004  . INFLUENZA VACCINE  12/15/2018  . PAP-Cervical Cytology Screening  09/07/2020  . PAP SMEAR-Modifier  09/07/2020  . TETANUS/TDAP  11/12/2024    The following portions of the patient's history were reviewed and updated as appropriate: allergies, current medications, past family  history, past medical history, past social history, past surgical history and problem list.  Review of Systems Pertinent items noted in HPI and remainder of comprehensive ROS otherwise negative.   Objective:  Blood pressure 128/84, pulse 98, height 5\' 6"  (1.676 m), weight 182 lb 4.8 oz (82.7 kg), last menstrual period 09/26/2018. Body mass index is 29.42 kg/m.  General appearance: alert, cooperative and appears stated age Neck: no adenopathy, no carotid bruit, no JVD, supple, symmetrical, trachea midline and thyroid not enlarged, symmetric, no tenderness/mass/nodules Lungs: clear to auscultation bilaterally Breasts: normal appearance, no masses or tenderness Heart: regular rate and rhythm, S1, S2 normal, no murmur, click, rub or gallop Abdomen: soft, non-tender; bowel sounds normal; no masses,  no organomegaly Pelvic: cervix normal in appearance, external genitalia normal, no adnexal masses or tenderness, no cervical motion tenderness, rectovaginal septum normal, uterus normal size, shape, and consistency and vagina normal without discharge    Assessment:    Healthy female exam.  OCP user    BMI 29   Plan:    desires to continue on taytulla for now.  RTC 1 year or as needed.   ,CNM See After Visit Summary for Counseling Recommendations

## 2018-10-18 NOTE — Addendum Note (Signed)
Addended by: Keturah Barre L on: 10/18/2018 01:37 PM   Modules accepted: Orders

## 2018-10-18 NOTE — Patient Instructions (Signed)
 Preventive Care 18-39 Years, Female Preventive care refers to lifestyle choices and visits with your health care provider that can promote health and wellness. What does preventive care include?   A yearly physical exam. This is also called an annual well check.  Dental exams once or twice a year.  Routine eye exams. Ask your health care provider how often you should have your eyes checked.  Personal lifestyle choices, including: ? Daily care of your teeth and gums. ? Regular physical activity. ? Eating a healthy diet. ? Avoiding tobacco and drug use. ? Limiting alcohol use. ? Practicing safe sex. ? Taking vitamin and mineral supplements as recommended by your health care provider. What happens during an annual well check? The services and screenings done by your health care provider during your annual well check will depend on your age, overall health, lifestyle risk factors, and family history of disease. Counseling Your health care provider may ask you questions about your:  Alcohol use.  Tobacco use.  Drug use.  Emotional well-being.  Home and relationship well-being.  Sexual activity.  Eating habits.  Work and work environment.  Method of birth control.  Menstrual cycle.  Pregnancy history. Screening You may have the following tests or measurements:  Height, weight, and BMI.  Diabetes screening. This is done by checking your blood sugar (glucose) after you have not eaten for a while (fasting).  Blood pressure.  Lipid and cholesterol levels. These may be checked every 5 years starting at age 20.  Skin check.  Hepatitis C blood test.  Hepatitis B blood test.  Sexually transmitted disease (STD) testing.  BRCA-related cancer screening. This may be done if you have a family history of breast, ovarian, tubal, or peritoneal cancers.  Pelvic exam and Pap test. This may be done every 3 years starting at age 21. Starting at age 30, this may be done  every 5 years if you have a Pap test in combination with an HPV test. Discuss your test results, treatment options, and if necessary, the need for more tests with your health care provider. Vaccines Your health care provider may recommend certain vaccines, such as:  Influenza vaccine. This is recommended every year.  Tetanus, diphtheria, and acellular pertussis (Tdap, Td) vaccine. You may need a Td booster every 10 years.  Varicella vaccine. You may need this if you have not been vaccinated.  HPV vaccine. If you are 26 or younger, you may need three doses over 6 months.  Measles, mumps, and rubella (MMR) vaccine. You may need at least one dose of MMR. You may also need a second dose.  Pneumococcal 13-valent conjugate (PCV13) vaccine. You may need this if you have certain conditions and were not previously vaccinated.  Pneumococcal polysaccharide (PPSV23) vaccine. You may need one or two doses if you smoke cigarettes or if you have certain conditions.  Meningococcal vaccine. One dose is recommended if you are age 19-21 years and a first-year college student living in a residence hall, or if you have one of several medical conditions. You may also need additional booster doses.  Hepatitis A vaccine. You may need this if you have certain conditions or if you travel or work in places where you may be exposed to hepatitis A.  Hepatitis B vaccine. You may need this if you have certain conditions or if you travel or work in places where you may be exposed to hepatitis B.  Haemophilus influenzae type b (Hib) vaccine. You may need this if   you have certain risk factors. Talk to your health care provider about which screenings and vaccines you need and how often you need them. This information is not intended to replace advice given to you by your health care provider. Make sure you discuss any questions you have with your health care provider. Document Released: 06/28/2001 Document Revised:  12/13/2016 Document Reviewed: 03/03/2015 Elsevier Interactive Patient Education  2019 [employer].

## 2018-10-25 LAB — GC/CHLAMYDIA PROBE AMP
Chlamydia trachomatis, NAA: NEGATIVE
Neisseria Gonorrhoeae by PCR: NEGATIVE

## 2019-05-14 ENCOUNTER — Encounter: Payer: Self-pay | Admitting: Certified Nurse Midwife

## 2019-05-14 ENCOUNTER — Other Ambulatory Visit: Payer: Self-pay

## 2019-05-14 MED ORDER — NORETHIN ACE-ETH ESTRAD-FE 1-20 MG-MCG(24) PO CAPS: 1.0000 | ORAL_CAPSULE | Freq: Every day | ORAL | 1 refills | Status: DC

## 2019-05-21 ENCOUNTER — Ambulatory Visit: Payer: Medicaid Other | Attending: Internal Medicine

## 2019-05-21 ENCOUNTER — Other Ambulatory Visit: Payer: Self-pay

## 2019-05-21 DIAGNOSIS — Z20822 Contact with and (suspected) exposure to covid-19: Secondary | ICD-10-CM

## 2019-05-23 LAB — NOVEL CORONAVIRUS, NAA: SARS-CoV-2, NAA: NOT DETECTED

## 2019-07-16 ENCOUNTER — Telehealth: Payer: Self-pay | Admitting: Certified Nurse Midwife

## 2019-07-16 NOTE — Telephone Encounter (Signed)
Patient is requesting her birth control be refilled. I wanted to try to get her on the schedule for this week but she was unable to make an appointment and she's almost out of her prescription. Could you please advise this patient? She is a former [facility] patient.

## 2019-07-17 NOTE — Telephone Encounter (Signed)
MyChart message sent.  See message for response.

## 2019-07-17 NOTE — Telephone Encounter (Signed)
Called patient to advise her to schedule an annual exam so that we may refill her OCP.  No answer, unable to [facility] due to VM being full.  Will try again later.

## 2019-07-19 ENCOUNTER — Other Ambulatory Visit: Payer: Self-pay

## 2019-07-19 MED ORDER — NORETHIN ACE-ETH ESTRAD-FE 1-20 MG-MCG(24) PO CAPS: 1.0000 | ORAL_CAPSULE | Freq: Every day | ORAL | 3 refills | Status: DC

## 2019-10-21 ENCOUNTER — Other Ambulatory Visit (HOSPITAL_COMMUNITY)
Admission: RE | Admit: 2019-10-21 | Discharge: 2019-10-21 | Disposition: A | Discharge status: Home / Self Care | Payer: Medicaid Other | Source: Ambulatory Visit | Attending: Certified Nurse Midwife | Admitting: Certified Nurse Midwife

## 2019-10-21 ENCOUNTER — Other Ambulatory Visit: Payer: Self-pay

## 2019-10-21 ENCOUNTER — Encounter: Payer: Self-pay | Admitting: Certified Nurse Midwife

## 2019-10-21 ENCOUNTER — Ambulatory Visit (INDEPENDENT_AMBULATORY_CARE_PROVIDER_SITE_OTHER): Payer: Medicaid Other | Admitting: Certified Nurse Midwife

## 2019-10-21 VITALS — BP 127/98 | HR 75 | Ht 66.0 in | Wt 190.5 lb

## 2019-10-21 DIAGNOSIS — Z8349 Family history of other endocrine, nutritional and metabolic diseases: Secondary | ICD-10-CM | POA: Diagnosis not present

## 2019-10-21 DIAGNOSIS — R635 Abnormal weight gain: Secondary | ICD-10-CM

## 2019-10-21 DIAGNOSIS — Z01419 Encounter for gynecological examination (general) (routine) without abnormal findings: Secondary | ICD-10-CM | POA: Diagnosis not present

## 2019-10-21 DIAGNOSIS — R21 Rash and other nonspecific skin eruption: Secondary | ICD-10-CM

## 2019-10-21 DIAGNOSIS — Z113 Encounter for screening for infections with a predominantly sexual mode of transmission: Secondary | ICD-10-CM | POA: Insufficient documentation

## 2019-10-21 DIAGNOSIS — Z0000 Encounter for general adult medical examination without abnormal findings: Secondary | ICD-10-CM | POA: Diagnosis not present

## 2019-10-21 DIAGNOSIS — R5383 Other fatigue: Secondary | ICD-10-CM

## 2019-10-21 DIAGNOSIS — Z3041 Encounter for surveillance of contraceptive pills: Secondary | ICD-10-CM | POA: Diagnosis not present

## 2019-10-21 NOTE — Patient Instructions (Addendum)
Rash, Adult  A rash is a change in the color of your skin. A rash can also change the way your skin feels. There are many different conditions and factors that can cause a rash. Follow these instructions at home: The goal of treatment is to stop the itching and keep the rash from spreading. Watch for any changes in your symptoms. Let your doctor know about them. Follow these instructions to help with your condition: Medicine Take or apply over-the-counter and prescription medicines only as told by your doctor. These may include medicines:  To treat red or swollen skin (corticosteroid creams).  To treat itching.  To treat an allergy (oral antihistamines).  To treat very bad symptoms (oral corticosteroids).  Skin care  Put cool cloths (compresses) on the affected areas.  Do not scratch or rub your skin.  Avoid covering the rash. Make sure that the rash is exposed to air as much as possible. Managing itching and discomfort  Avoid hot showers or baths. These can make itching worse. A cold shower may help.  Try taking a bath with: ? Epsom salts. You can get these at your local pharmacy or grocery store. Follow the instructions on the package. ? Baking soda. Pour a small amount into the bath as told by your doctor. ? Colloidal oatmeal. You can get this at your local pharmacy or grocery store. Follow the instructions on the package.  Try putting baking soda paste onto your skin. Stir water into baking soda until it gets like a paste.  Try putting on a lotion that relieves itchiness (calamine lotion).  Keep cool and out of the sun. Sweating and being hot can make itching worse. General instructions   Rest as needed.  Drink enough fluid to keep your pee (urine) pale yellow.  Wear loose-fitting clothing.  Avoid scented soaps, detergents, and perfumes. Use gentle soaps, detergents, perfumes, and other cosmetic products.  Avoid anything that causes your rash. Keep a journal to  help track what causes your rash. Write down: ? What you eat. ? What cosmetic products you use. ? What you drink. ? What you wear. This includes jewelry.  Keep all follow-up visits as told by your doctor. This is important. Contact a doctor if:  You sweat at night.  You lose weight.  You pee (urinate) more than normal.  You pee less than normal, or you notice that your pee is a darker color than normal.  You feel weak.  You throw up (vomit).  Your skin or the whites of your eyes look yellow (jaundice).  Your skin: ? Tingles. ? Is numb.  Your rash: ? Does not go away after a few days. ? Gets worse.  You are: ? More thirsty than normal. ? More tired than normal.  You have: ? New symptoms. ? Pain in your belly (abdomen). ? A fever. ? Watery poop (diarrhea). Get help right away if:  You have a fever and your symptoms suddenly get worse.  You start to feel mixed up (confused).  You have a very bad headache or a stiff neck.  You have very bad joint pains or stiffness.  You have jerky movements that you cannot control (seizure).  Your rash covers all or most of your body. The rash may or may not be painful.  You have blisters that: ? Are on top of the rash. ? Grow larger. ? Grow together. ? Are painful. ? Are inside your nose or mouth.  You have a rash  that: ? Looks like purple pinprick-sized spots all over your body. ? Has a "bull's eye" or looks like a target. ? Is red and painful, causes your skin to peel, and is not from being in the sun too long. Summary  A rash is a change in the color of your skin. A rash can also change the way your skin feels.  The goal of treatment is to stop the itching and keep the rash from spreading.  Take or apply over-the-counter and prescription medicines only as told by your doctor.  Contact a doctor if you have new symptoms or symptoms that get worse.  Keep all follow-up visits as told by your doctor. This is  important. This information is not intended to replace advice given to you by your health care provider. Make sure you discuss any questions you have with your health care provider. Document Revised: 08/24/2018 Document Reviewed: 12/04/2017 Elsevier Patient Education  [place].   Ethinyl Estradiol; Norethindrone Acetate; Ferrous fumarate tablets or capsules What is this medicine? ETHINYL ESTRADIOL; NORETHINDRONE ACETATE; FERROUS FUMARATE (ETH in il es tra DYE ole; nor eth IN drone AS e tate; FER [place] FUE ma rate) is an oral contraceptive. The products combine two types of female hormones, an estrogen and a progestin. They are used to prevent ovulation and pregnancy. Some products are also used to treat acne in females. This medicine may be used for other purposes; ask your health care provider or pharmacist if you have questions. COMMON BRAND NAME(S): Aurovela [address] 1/20, Aurovela Fe, Bl[place] [address], [address] Fe, Estrostep Fe, Gildess 24 Fe, Gildess Fe 1.5/30, Gildess Fe 1/20, Hailey 24 Fe, Hailey Fe 1.5/30, Junel Fe 1.5/30, Junel Fe 1/20, Junel Fe 24, Larin Fe, Lo Loestrin Fe, Loestrin 24 Fe, Loestrin FE 1.5/30, Loestrin FE 1/20, Lomedia 24 Fe, Microgestin 24 Fe, Microgestin Fe 1.5/30, Microgestin Fe 1/20, Tarina 24 Fe, Tarina Fe 1/20, Tayt[place], Tilia Fe, Tri-Legest Fe What should I tell my health care provider before I take this medicine? They need to know if you have any of these conditions:  abnormal vaginal bleeding  blood vessel disease  breast, cervical, endometrial, ovarian, liver, or uterine cancer  diabetes  gallbladder disease  heart disease or recent heart attack  high blood pressure  high cholesterol  history of blood clots  kidney disease  liver disease  migraine headaches  smoke tobacco  stroke  systemic lupus erythematosus (SLE)  an unusual or allergic reaction to estrogens, progestins, other medicines, foods, dyes, or preservatives  pregnant or  trying to get pregnant  breast-feeding How should I use this medicine? Take this medicine by mouth. To reduce nausea, this medicine may be taken with food. Follow the directions on the prescription label. Take this medicine at the same time each day and in the order directed on the package. Do not take your medicine more often than directed. A patient package insert for the product will be given with each prescription and refill. Read this sheet carefully each time. The sheet may change frequently. Contact your pediatrician regarding the use of this medicine in children. Special care may be needed. This medicine has been used in female children who have started having menstrual periods. Overdosage: If you think you have taken too much of this medicine contact a poison control center or emergency room at once. NOTE: This medicine is only for you. Do not share this medicine with others. What if I miss a dose? If you miss a  dose, refer to the patient information sheet you received with your medicine for direction. If you miss more than one pill, this medicine may not be as effective and you may need to use another form of birth control. What may interact with this medicine? Do not take this medicine with the following medication:  dasabuvir; ombitasvir; paritaprevir; ritonavir  ombitasvir; paritaprevir; ritonavir This medicine may also interact with the following medications:  acetaminophen  antibiotics or medicines for infections, especially rifampin, rifabutin, rifapentine, and griseofulvin, and possibly penicillins or tetracyclines  aprepitant  ascorbic acid (vitamin C)  atorvastatin  barbiturate medicines, such as phenobarbital  bosentan  carbamazepine  caffeine  clofibrate  cyclosporine  dantrolene  doxercalciferol  felbamate  grapefruit juice  hydrocortisone  medicines for anxiety or sleeping problems, such as diazepam or temazepam  medicines for diabetes,  including pioglitazone  mineral oil  modafinil  mycophenolate  nefazodone  oxcarbazepine  phenytoin  prednisolone  ritonavir or other medicines for HIV infection or AIDS  rosuvastatin  selegiline  soy isoflavones supplements  St. John's wort  tamoxifen or raloxifene  theophylline  thyroid hormones  topiramate  warfarin This list may not describe all possible interactions. Give your health care provider a list of all the medicines, herbs, non-prescription drugs, or dietary supplements you use. Also tell them if you smoke, drink alcohol, or use illegal drugs. Some items may interact with your medicine. What should I watch for while using this medicine? Visit your doctor or health care professional for regular checks on your progress. You will need a regular breast and pelvic exam and Pap smear while on this medicine. Use an additional method of contraception during the first cycle that you take these tablets. If you have any reason to think you are pregnant, stop taking this medicine right away and contact your doctor or health care professional. If you are taking this medicine for hormone related problems, it may take several cycles of use to see improvement in your condition. Smoking increases the risk of getting a blood clot or having a stroke while you are taking birth control pills, especially if you are more than 30 years old. You are strongly advised not to smoke. This medicine can make your body retain fluid, making your fingers, hands, or ankles swell. Your blood pressure can go up. Contact your doctor or health care professional if you feel you are retaining fluid. This medicine can make you more sensitive to the sun. Keep out of the sun. If you cannot avoid being in the sun, wear protective clothing and use sunscreen. Do not use sun lamps or tanning beds/booths. If you wear contact lenses and notice visual changes, or if the lenses begin to feel uncomfortable,  consult your eye care specialist. In some women, tenderness, swelling, or minor bleeding of the gums may occur. Notify your dentist if this happens. Brushing and flossing your teeth regularly may help limit this. See your dentist regularly and inform your dentist of the medicines you are taking. If you are going to have elective surgery, you may need to stop taking this medicine before the surgery. Consult your health care professional for advice. This medicine does not protect you against HIV infection (AIDS) or any other sexually transmitted diseases. What side effects may I notice from receiving this medicine? Side effects that you should report to your doctor or health care professional as soon as possible:  allergic reactions like skin rash, itching or hives, swelling of the face, lips, or  tongue  breast tissue changes or discharge  changes in vaginal bleeding during your period or between your periods  changes in vision  chest pain  confusion  coughing up blood  dizziness  feeling faint or lightheaded  headaches or migraines  leg, arm or groin pain  loss of balance or coordination  severe or sudden headaches  stomach pain (severe)  sudden shortness of breath  sudden numbness or weakness of the face, arm or leg  symptoms of vaginal infection like itching, irritation or unusual discharge  tenderness in the upper abdomen  trouble speaking or understanding  vomiting  yellowing of the eyes or skin Side effects that usually do not require medical attention (report to your doctor or health care professional if they continue or are bothersome):  breakthrough bleeding and spotting that continues beyond the 3 initial cycles of pills  breast tenderness  mood changes, anxiety, depression, frustration, anger, or emotional outbursts  increased sensitivity to sun or ultraviolet light  nausea  skin rash, acne, or brown spots on the skin  weight gain (slight) This  list may not describe all possible side effects. Call your doctor for medical advice about side effects. You may report side effects to FDA at 1-800-FDA-1088. Where should I keep my medicine? Keep out of the reach of children. Store at room temperature between 15 and 30 degrees C (59 and 86 degrees F). Throw away any unused medicine after the expiration date. NOTE: This sheet is a summary. It may not cover all possible information. If you have questions about this medicine, talk to your doctor, pharmacist, or health care provider.  2020 Elsevier/Gold Standard (2016-01-11 08:04:41)   Preventive Care 76-16 Years Old, Female Preventive care refers to visits with your health care provider and lifestyle choices that can promote health and wellness. This includes:  A yearly physical exam. This may also be called an annual well check.  Regular dental visits and eye exams.  Immunizations.  Screening for certain conditions.  Healthy lifestyle choices, such as eating a healthy diet, getting regular exercise, not using drugs or products that contain nicotine and tobacco, and limiting alcohol use. What can I expect for my preventive care visit? Physical exam Your health care provider will check your:  Height and weight. This may be used to calculate body mass index (BMI), which tells if you are at a healthy weight.  Heart rate and blood pressure.  Skin for abnormal spots. Counseling Your health care provider may ask you questions about your:  Alcohol, tobacco, and drug use.  Emotional well-being.  Home and relationship well-being.  Sexual activity.  Eating habits.  Work and work Statistician.  Method of birth control.  Menstrual cycle.  Pregnancy history. What immunizations do I need?  Influenza (flu) vaccine  This is recommended every year. Tetanus, diphtheria, and pertussis (Tdap) vaccine  You may need a Td booster every 10 years. Varicella (chickenpox) vaccine  You  may need this if you have not been vaccinated. Human papillomavirus (HPV) vaccine  If recommended by your health care provider, you may need three doses over 6 months. Measles, mumps, and rubella (MMR) vaccine  You may need at least one dose of MMR. You may also need a second dose. Meningococcal conjugate (MenACWY) vaccine  One dose is recommended if you are age 80-21 years and a first-year college student living in a residence hall, or if you have one of several medical conditions. You may also need additional booster doses. Pneumococcal conjugate (  PCV13) vaccine  You may need this if you have certain conditions and were not previously vaccinated. Pneumococcal polysaccharide (PPSV23) vaccine  You may need one or two doses if you smoke cigarettes or if you have certain conditions. Hepatitis A vaccine  You may need this if you have certain conditions or if you travel or work in places where you may be exposed to hepatitis A. Hepatitis B vaccine  You may need this if you have certain conditions or if you travel or work in places where you may be exposed to hepatitis B. Haemophilus influenzae type b (Hib) vaccine  You may need this if you have certain conditions. You may receive vaccines as individual doses or as more than one vaccine together in one shot (combination vaccines). Talk with your health care provider about the risks and benefits of combination vaccines. What tests do I need?  Blood tests  Lipid and cholesterol levels. These may be checked every 5 years starting at age 38.  Hepatitis C test.  Hepatitis B test. Screening  Diabetes screening. This is done by checking your blood sugar (glucose) after you have not eaten for a while (fasting).  Sexually transmitted disease (STD) testing.  BRCA-related cancer screening. This may be done if you have a family history of breast, ovarian, tubal, or peritoneal cancers.  Pelvic exam and Pap test. This may be done every 3  years starting at age 55. Starting at age 80, this may be done every 5 years if you have a Pap test in combination with an HPV test. Talk with your health care provider about your test results, treatment options, and if necessary, the need for more tests. Follow these instructions at home: Eating and drinking   Eat a diet that includes fresh fruits and vegetables, whole grains, lean protein, and low-fat dairy.  Take vitamin and mineral supplements as recommended by your health care provider.  Do not drink alcohol if: ? Your health care provider tells you not to drink. ? You are pregnant, may be pregnant, or are planning to become pregnant.  If you drink alcohol: ? Limit how much you have to 0-1 drink a day. ? Be aware of how much alcohol is in your drink. In the [place] one drink equals one 12 oz bottle of beer (355 mL), one 5 oz glass of wine (148 mL), or one 1 oz glass of hard liquor (44 mL). Lifestyle  Take daily care of your teeth and gums.  Stay active. Exercise for at least 30 minutes on 5 or more days each week.  Do not use any products that contain nicotine or tobacco, such as cigarettes, e-cigarettes, and chewing tobacco. If you need help quitting, ask your health care provider.  If you are sexually active, practice safe sex. Use a condom or other form of birth control (contraception) in order to prevent pregnancy and STIs (sexually transmitted infections). If you plan to become pregnant, see your health care provider for a preconception visit. What's next?  Visit your health care provider once a year for a well check visit.  Ask your health care provider how often you should have your eyes and teeth checked.  Stay up to date on all vaccines. This information is not intended to replace advice given to you by your health care provider. Make sure you discuss any questions you have with your health care provider. Document Revised: 01/11/2018 Document Reviewed:  01/11/2018 Elsevier Patient Education  2020 [employer].

## 2019-10-21 NOTE — Progress Notes (Signed)
I have seen, interviewed, and examined the patient in conjunction with the [facility] Nurse Practitioner student and affirm the diagnosis and management plan.   Diona Fanti, CNM [facility], [facility] 10/21/19 1:27 PM

## 2019-10-21 NOTE — Progress Notes (Signed)
ANNUAL PREVENTATIVE CARE GYN  ENCOUNTER NOTE  Subjective:       Dawn Morris is a 29 y.o. G32P1011 female here for a routine annual gynecologic exam.  Current complaints: 1.  Rash 2. Weight gain  Doing well. Reports rash to both legs. Noticed yesterday after playing eight (8) softball games. Area felt numb and tingly last night and was itching, used Hydrocortisone cream with some relief.  Notes weight gain. Believes it is from the [place].  Exercises at least four (4) times a week and drinks a gallon of water or more a day.  Denies difficulty breathing or respiratory distress, chest pain, abdominal pain, excessive vaginal bleeding, dysuria, leg pain or swelling   Gynecologic History  Patient's last menstrual period was 10/12/2019 (exact date). Period Cycle (Days): 28 Period Duration (Days): 2-3 Period Pattern: Regular Menstrual Flow: Light Dysmenorrhea: None  Contraception: OCP (estrogen/progesterone)   Last Pap: 09/07/17. Results were: Negative.  Obstetric History  OB History  Gravida Para Term Preterm AB Living  2 1 1   1 1   SAB TAB Ectopic Multiple Live Births  1     0 1    # Outcome Date GA Lbr Len/2nd Weight Sex Delivery Anes PTL Lv  2 SAB 2018          1 Term 01/30/15 [redacted]w[redacted]d 11:52 / 01:14 8 lb 0.4 oz (3.64 kg) F Vag-Spont EPI  LIV     Birth Comments: none noted    Past Medical History:  Diagnosis Date  . Increased BMI   . [facility])     Past Surgical History:  Procedure Laterality Date  . ACHILLES TENDON SURGERY Right 12/06/2017   Procedure: ACHILLES TENDON REPAIR;  Surgeon: Earnestine Leys, MD;  Location: [facility] ORS;  Service: Orthopedics;  Laterality: Right;  . MELANOMA EXCISION    . TONSILLECTOMY  2005    Current Outpatient Medications on File Prior to Visit  Medication Sig Dispense Refill  . gabapentin (NEURONTIN) 400 MG capsule gabapentin 400 mg capsule  TAKE 1 CAPSULE BY MOUTH THREE TIMES DAILY    . meloxicam (MOBIC) 15 MG tablet  meloxicam 15 mg tablet  TAKE 1 TABLET BY MOUTH ONCE DAILY FOR 7 DAYS    . Norethin Ace-Eth Estrad-FE (TAYTULLA) 1-20 MG-MCG(24) CAPS Take 1 tablet by mouth daily. 28 capsule 3   No current facility-administered medications on file prior to visit.    No Known Allergies  Social History   Socioeconomic History  . Marital status: Single    Spouse name: Not on file  . Number of children: Not on file  . Years of education: Not on file  . Highest education level: Not on file  Occupational History  . Not on file  Tobacco Use  . Smoking status: Never Smoker  . Smokeless tobacco: Never Used  Substance and Sexual Activity  . Alcohol use: No  . Drug use: No  . Sexual activity: Yes    Birth control/protection: Pill  Other Topics Concern  . Not on file  Social History Narrative  . Not on file   Social Determinants of Health   Financial Resource Strain:   . Difficulty of Paying Living Expenses:   Food Insecurity:   . Worried About Charity fundraiser in the Last Year:   . Arboriculturist in the Last Year:   Transportation Needs:   . Film/video editor (Medical):   Marland Kitchen Lack of Transportation (Non-Medical):   Physical Activity:   .  Days of Exercise per Week:   . Minutes of Exercise per Session:   Stress:   . Feeling of Stress :   Social Connections:   . Frequency of Communication with Friends and Family:   . Frequency of Social Gatherings with Friends and Family:   . Attends Religious Services:   . Active Member of Clubs or Organizations:   . Attends Archivist Meetings:   Marland Kitchen Marital Status:   Intimate Partner Violence:   . Fear of Current or Ex-Partner:   . Emotionally Abused:   Marland Kitchen Physically Abused:   . Sexually Abused:     Family History  Problem Relation Age of Onset  . Cancer Neg Hx   . Diabetes Neg Hx   . Heart disease Neg Hx   . Breast cancer Neg Hx   . Ovarian cancer Neg Hx   . Colon cancer Neg Hx     The following portions of the patient's  history were reviewed and updated as appropriate: allergies, current medications, past family history, past medical history, past social history, past surgical history and problem list.  Review of Systems  ROS negative except as noted above. Information obtained from patient.    Objective:   BP (!) 127/98   Pulse 75   Ht 5\' 6"  (1.676 m)   Wt 190 lb 8 oz (86.4 kg)   LMP 10/12/2019 (Exact Date)   BMI 30.75 kg/m    CONSTITUTIONAL: Well-developed, well-nourished female in no acute distress.   PSYCHIATRIC: Normal mood and affect. Normal behavior. Normal judgment and thought content.  [place]: Alert and oriented to person, place, and time. Normal muscle tone coordination. No cranial nerve deficit noted.  HENT:  Normocephalic, atraumatic, External right and left ear normal. Oropharynx is clear and moist  EYES: Conjunctivae and EOM are normal. No scleral icterus.   NECK: Normal range of motion, supple, no masses.  Normal thyroid.   SKIN: Skin is warm and dry.  Not diaphoretic. No pallor. Multiple pin point macules, not well demarcated noted to bilateral calves.  CARDIOVASCULAR: Normal heart rate noted, regular rhythm, no murmur.  RESPIRATORY: Clear to auscultation bilaterally. Effort and breath sounds normal, no problems with respiration noted.  BREASTS: Symmetric in size. No masses, skin changes, nipple drainage, or lymphadenopathy.  ABDOMEN: Soft, normal bowel sounds, no distention noted.  No tenderness, rebound or guarding.   PELVIC:  External Genitalia: Normal  Vagina: Normal  Uterus: Normal  Adnexa: Normal   MUSCULOSKELETAL: Normal range of motion. No tenderness.  No cyanosis, clubbing, or edema.  2+ distal pulses.  LYMPHATIC: No Axillary, Supraclavicular, or Inguinal Adenopathy.  Assessment:   Annual gynecologic examination 29 y.o.   Contraception: OCP (estrogen/progesterone)   Obesity 1   Problem List Items Addressed This Visit      Other   Family history  of Hashimoto thyroiditis   Relevant Orders   TSH    Other Visit Diagnoses    Well woman exam with routine gynecological exam    -  Primary   Relevant Orders   CBC w/Diff   Comprehensive metabolic panel   Lipid panel   Oral contraceptive pill surveillance       Routine screening for STI (sexually transmitted infection)       Relevant Orders   HIV Antibody (routine testing w rflx)   Cervicovaginal ancillary only   RPR   Rash       Fatigue, unspecified type       Relevant Orders  TSH   VITAMIN D 25 Hydroxy (Vit-D Deficiency, Fractures)   Weight gain          Plan:   Pap: Not needed   Labs: See Orders.  Education given regarding options for contraception, including barrier methods, injectable contraception, IUD placement, oral contraceptives, Nexplanon, vaginal ring and patch.  Patient wishes to stay on a pill. Lo-Loestrin samples provided in office today  Discussed treatment for Rash with patient. Encouraged to use Kenalog prn. Also advised could use Nystatin or Diaper Rash cream. Benadryl prn for itching.   Routine preventative health maintenance measures emphasized: Exercise/Diet/Weight control, Tobacco Warnings, Alcohol/Substance use risks and Stress Management. See AVS.  Reviewed red flags and when to call the office.  RTC x 3 months for Birth Control follow up then RTC x 1 year for ANNUAL EXAM and PAP or sooner if needed  [facility] 10/21/19 12:48 PM

## 2019-10-22 LAB — CBC WITH DIFFERENTIAL/PLATELET
Basophils Absolute: 0.1 10*3/uL (ref 0.0–0.2)
Basos: 1 %
EOS (ABSOLUTE): 0.1 10*3/uL (ref 0.0–0.4)
Eos: 1 %
Hematocrit: 37.1 % (ref 34.0–46.6)
Hemoglobin: 12.5 g/dL (ref 11.1–15.9)
Immature Grans (Abs): 0 10*3/uL (ref 0.0–0.1)
Immature Granulocytes: 0 %
Lymphocytes Absolute: 2.2 10*3/uL (ref 0.7–3.1)
Lymphs: 31 %
MCH: 30.8 pg (ref 26.6–33.0)
MCHC: 33.7 g/dL (ref 31.5–35.7)
MCV: 91 fL (ref 79–97)
Monocytes Absolute: 0.6 10*3/uL (ref 0.1–0.9)
Monocytes: 8 %
Neutrophils Absolute: 4.1 10*3/uL (ref 1.4–7.0)
Neutrophils: 59 %
Platelets: 294 10*3/uL (ref 150–450)
RBC: 4.06 x10E6/uL (ref 3.77–5.28)
RDW: 12.2 % (ref 11.7–15.4)
WBC: 7 10*3/uL (ref 3.4–10.8)

## 2019-10-22 LAB — LIPID PANEL
Chol/HDL Ratio: 4.1 ratio (ref 0.0–4.4)
Cholesterol, Total: 215 mg/dL — ABNORMAL HIGH (ref 100–199)
HDL: 53 mg/dL (ref >39)
LDL Chol Calc (NIH): 118 mg/dL — ABNORMAL HIGH (ref 0–99)
Triglycerides: 254 mg/dL — ABNORMAL HIGH (ref 0–149)
VLDL Cholesterol Cal: 44 mg/dL — ABNORMAL HIGH (ref 5–40)

## 2019-10-22 LAB — CERVICOVAGINAL ANCILLARY ONLY
Bacterial Vaginitis (gardnerella): NEGATIVE
Candida Glabrata: NEGATIVE
Candida Vaginitis: POSITIVE — AB
Chlamydia: NEGATIVE
Comment: NEGATIVE
Comment: NEGATIVE
Comment: NEGATIVE
Comment: NEGATIVE
Comment: NEGATIVE
Comment: NORMAL
Neisseria Gonorrhea: NEGATIVE
Trichomonas: NEGATIVE

## 2019-10-22 LAB — COMPREHENSIVE METABOLIC PANEL
ALT: 18 IU/L (ref 0–32)
AST: 21 IU/L (ref 0–40)
Albumin/Globulin Ratio: 1.7 (ref 1.2–2.2)
Albumin: 4.3 g/dL (ref 3.9–5.0)
Alkaline Phosphatase: 90 IU/L (ref 48–121)
BUN/Creatinine Ratio: 16 (ref 9–23)
BUN: 13 mg/dL (ref 6–20)
Bilirubin Total: 0.7 mg/dL (ref 0.0–1.2)
CO2: 20 mmol/L (ref 20–29)
Calcium: 9.2 mg/dL (ref 8.7–10.2)
Chloride: 101 mmol/L (ref 96–106)
Creatinine, Ser: 0.8 mg/dL (ref 0.57–1.00)
GFR calc Af Amer: 115 mL/min/{1.73_m2} (ref >59)
GFR calc non Af Amer: 100 mL/min/{1.73_m2} (ref >59)
Globulin, Total: 2.6 g/dL (ref 1.5–4.5)
Glucose: 98 mg/dL (ref 65–99)
Potassium: 3.9 mmol/L (ref 3.5–5.2)
Sodium: 137 mmol/L (ref 134–144)
Total Protein: 6.9 g/dL (ref 6.0–8.5)

## 2019-10-22 LAB — TSH: TSH: 1.67 u[IU]/mL (ref 0.450–4.500)

## 2019-10-22 LAB — VITAMIN D 25 HYDROXY (VIT D DEFICIENCY, FRACTURES): Vit D, 25-Hydroxy: 26.9 ng/mL — ABNORMAL LOW (ref 30.0–100.0)

## 2019-10-22 LAB — RPR: RPR Ser Ql: NONREACTIVE

## 2019-10-22 LAB — HIV ANTIBODY (ROUTINE TESTING W REFLEX): HIV Screen 4th Generation wRfx: NONREACTIVE

## 2019-11-01 ENCOUNTER — Other Ambulatory Visit (INDEPENDENT_AMBULATORY_CARE_PROVIDER_SITE_OTHER): Payer: Medicaid Other | Admitting: Certified Nurse Midwife

## 2019-11-01 ENCOUNTER — Other Ambulatory Visit: Payer: Self-pay | Admitting: Certified Nurse Midwife

## 2019-11-01 DIAGNOSIS — E559 Vitamin D deficiency, unspecified: Secondary | ICD-10-CM

## 2019-11-01 DIAGNOSIS — Z6830 Body mass index (BMI) 30.0-30.9, adult: Secondary | ICD-10-CM

## 2019-11-01 DIAGNOSIS — E782 Mixed hyperlipidemia: Secondary | ICD-10-CM

## 2019-11-01 DIAGNOSIS — R635 Abnormal weight gain: Secondary | ICD-10-CM

## 2019-11-01 MED ORDER — VITAMIN D (ERGOCALCIFEROL) 1.25 MG (50000 UNIT) PO CAPS: 50000.0000 [IU] | ORAL_CAPSULE | ORAL | 1 refills | Status: DC

## 2019-11-01 NOTE — Progress Notes (Signed)
Referral to Medical Nutrition Therapy, see orders.    Dani Gobble, CNM [facility], [facility] 11/01/19 3:01 PM

## 2019-11-01 NOTE — Progress Notes (Signed)
Rx Vitamin D, see orders.   Dani Gobble, CNM [facility], [facility] 11/01/19 2:58 PM

## 2019-11-11 NOTE — Telephone Encounter (Signed)
Could you please check the status of her nutrition referral. Thanks, JML

## 2019-11-25 ENCOUNTER — Other Ambulatory Visit: Payer: Self-pay

## 2019-11-25 ENCOUNTER — Encounter: Payer: Self-pay | Admitting: Dietician

## 2019-11-25 ENCOUNTER — Encounter: Payer: Medicaid Other | Attending: Certified Nurse Midwife | Admitting: Dietician

## 2019-11-25 VITALS — Ht 66.0 in | Wt 191.6 lb

## 2019-11-25 DIAGNOSIS — Z713 Dietary counseling and surveillance: Secondary | ICD-10-CM | POA: Insufficient documentation

## 2019-11-25 DIAGNOSIS — E785 Hyperlipidemia, unspecified: Secondary | ICD-10-CM | POA: Insufficient documentation

## 2019-11-25 DIAGNOSIS — E559 Vitamin D deficiency, unspecified: Secondary | ICD-10-CM

## 2019-11-25 DIAGNOSIS — E781 Pure hyperglyceridemia: Secondary | ICD-10-CM | POA: Insufficient documentation

## 2019-11-25 NOTE — Patient Instructions (Signed)
   Eat breakfast 4-5 times a week   Try making a smoothie or boiled eggs   Measure your carb-rich foods for lunch and dinner meals   2-3 servings of carbs at meals   1-2 servings of carbs at snacks   Continue to find ways to de-stress

## 2019-11-25 NOTE — Progress Notes (Signed)
Medical Nutrition Therapy: Visit start time: 1884  end time: 1430  Assessment:  Diagnosis: high cholesterol, elevated triglycerides, vitamin d deficiency  Past medical history: none Psychosocial issues/ stress concerns: pt rates stress level as high and dealing with stress "ok".  Provided pt with pastoral care contact information  Preferred learning method:  . Auditory . Visual . Hands-on  Current weight: 191.6 lbs  Height: 5'6" Medications, supplements: reconciled in medical record  Labs- Total cholesterol 215 (H), Triglycerides 254 (H), LDL 118 (H), Vit D, Hydroxy- 26.9 (L)  Progress and evaluation:   Pt taking prescribed Vit D supplement  Pt reports often feels low energy, drowsy, and fatigue  Pt reports having to create new routine now that daughter lives with her full time  Pt states support system lives in the area   Pt reports appetite fluctuates and when busy it's easy to go all day without eating  Physical activity: competitive softball league practice 1-2 days week, weekend games  8am-8pm  Dietary Intake:  Usual eating pattern includes 1-2 meals and 1-2 snacks per day. Dining out frequency: 1-4 meals per week.  Breakfast: skips  Lunch: salad ([place] and cheese, tom and cuke) and (1/4 cup) ranch  Snack: nuts, sun chips  Supper: pizza rolls, [place] cheese roll Snack: string cheese  Beverages: water, gatorade, propel, green tea, 1 can gingerale/sprite   Estimated energy intake: 1000-1200kcal/d Estimated energy needs: 1400-1600kcal/d  Nutrition Care Education:  Basic nutrition: basic food groups, appropriate nutrient balance, appropriate meal and snack schedule, general nutrition guidelines    Weight control: determining reasonable weight loss rate, importance of low sugar and low fat choices, portion control strategies  Hypertension:  importance of controlling BP, identifying high sodium foods, identifying food sources of potassium, magnesium Hyperlipidemia:   target goals for lipids, healthy and unhealthy fats, role of fiber, plant sterols, role of exercise Other lifestyle changes:  benefits of making changes  Nutritional Diagnosis:  NB-1.1 Food and nutrition-related knowledge deficit As related to no prior high cholesterol related nutrition education.  As evidenced by questions regarding role of fiber and sources of healthy fats. NI-2.1 Inadequate intake As related to food and nutrition related knowledge deficit concerning appropriate oral food/beverage intake.  As evidenced by pt diet recall and consuming <75% estimated energy needs.  Intervention:  Pt may be consuming less than 1200 kcal most days.  Pt diet seems low in fiber and F/V. Fluid intake is good.  Pt has healthy outlet for stress via softball.  Pt may benefit from additional help with managing stress.  Recommend meeting at least once with a therapist.  Increase energy intake  Eat at least three meals a day   Breakfast- drinking meal may be more tolerated than eating meal  Increase F/V intake  Incorporate fruit or vegetables at breakfast  Increase fiber intake  Incorporate whole grains into diet (quinoa, grits, oatmeal, brown rice)  Increase bean, nuts, and seeds consumption    Education Materials given:  . General diet guidelines for Cholesterol-lowering/ Heart health . Plate Planner with food lists . Mediterranean diet  . Snacking handout . Goals/ instructions  Learner/ who was taught:  . Patient   Level of understanding: Marland Kitchen Verbalizes/ demonstrates competency  Demonstrated degree of understanding via:   Teach back Learning barriers: . None  Willingness to learn/ readiness for change: . Eager, change in progress  Monitoring and Evaluation:  Dietary intake, exercise, LDL, total cholesterol, Vitamin D, and body weight      follow  up: 4-6 weeks

## 2019-12-01 ENCOUNTER — Other Ambulatory Visit: Payer: Self-pay | Admitting: Certified Nurse Midwife

## 2019-12-01 DIAGNOSIS — E782 Mixed hyperlipidemia: Secondary | ICD-10-CM

## 2019-12-01 DIAGNOSIS — Z6830 Body mass index (BMI) 30.0-30.9, adult: Secondary | ICD-10-CM

## 2019-12-01 MED ORDER — PHENTERMINE HCL 37.5 MG PO CAPS: 37.5000 mg | ORAL_CAPSULE | ORAL | 0 refills | Status: DC

## 2019-12-01 NOTE — Progress Notes (Signed)
Rx Phentermine, see orders.   Dani Gobble, CNM [facility], [facility] 12/01/19 3:38 PM

## 2019-12-02 NOTE — Telephone Encounter (Signed)
Please contact patient to schedule weight management visit with me in one (1) month. Thanks, JML

## 2019-12-14 ENCOUNTER — Other Ambulatory Visit: Payer: Self-pay

## 2019-12-14 ENCOUNTER — Emergency Department: Payer: Medicaid Other

## 2019-12-14 ENCOUNTER — Emergency Department
Admission: EM | Admit: 2019-12-14 | Discharge: 2019-12-14 | Disposition: A | Discharge status: Home / Self Care | Payer: Medicaid Other | Attending: Emergency Medicine | Admitting: Emergency Medicine

## 2019-12-14 DIAGNOSIS — Z5321 Procedure and treatment not carried out due to patient leaving prior to being seen by health care provider: Secondary | ICD-10-CM | POA: Diagnosis not present

## 2019-12-14 DIAGNOSIS — Y999 Unspecified external cause status: Secondary | ICD-10-CM | POA: Diagnosis not present

## 2019-12-14 DIAGNOSIS — Y929 Unspecified place or not applicable: Secondary | ICD-10-CM | POA: Insufficient documentation

## 2019-12-14 DIAGNOSIS — R200 Anesthesia of skin: Secondary | ICD-10-CM | POA: Diagnosis not present

## 2019-12-14 DIAGNOSIS — W2107XA Struck by softball, initial encounter: Secondary | ICD-10-CM | POA: Diagnosis not present

## 2019-12-14 DIAGNOSIS — H538 Other visual disturbances: Secondary | ICD-10-CM | POA: Diagnosis not present

## 2019-12-14 DIAGNOSIS — S0990XA Unspecified injury of head, initial encounter: Secondary | ICD-10-CM | POA: Insufficient documentation

## 2019-12-14 DIAGNOSIS — Y939 Activity, unspecified: Secondary | ICD-10-CM | POA: Diagnosis not present

## 2019-12-14 NOTE — ED Notes (Signed)
Pt to desk asking about wait time. Pt updated to the best of this RNs ability. Pt states the pain is persistent but otherwise feels like she wants to go home to rest at this time.

## 2019-12-14 NOTE — ED Triage Notes (Signed)
Pt arrives via POV stating she was hit in the left side of her head with a softball. Pt reports left hand is numb and tingling and this started about 30 mins after the injury. Pt reports vision is blurry in the left eye and left ear feels full. Pt speech clear, ambulatory from triage with steady gait. Skin warm and dry

## 2020-01-03 ENCOUNTER — Encounter: Payer: Self-pay | Admitting: Certified Nurse Midwife

## 2020-01-03 ENCOUNTER — Ambulatory Visit (INDEPENDENT_AMBULATORY_CARE_PROVIDER_SITE_OTHER): Payer: Medicaid Other | Admitting: Certified Nurse Midwife

## 2020-01-03 ENCOUNTER — Other Ambulatory Visit: Payer: Self-pay

## 2020-01-03 VITALS — BP 136/96 | HR 80 | Ht 66.0 in | Wt 185.0 lb

## 2020-01-03 DIAGNOSIS — Z7689 Persons encountering health services in other specified circumstances: Secondary | ICD-10-CM

## 2020-01-03 DIAGNOSIS — E663 Overweight: Secondary | ICD-10-CM | POA: Diagnosis not present

## 2020-01-03 DIAGNOSIS — Z6829 Body mass index (BMI) 29.0-29.9, adult: Secondary | ICD-10-CM

## 2020-01-03 DIAGNOSIS — E782 Mixed hyperlipidemia: Secondary | ICD-10-CM | POA: Diagnosis not present

## 2020-01-03 DIAGNOSIS — Z0130 Encounter for examination of blood pressure without abnormal findings: Secondary | ICD-10-CM

## 2020-01-03 MED ORDER — PHENTERMINE HCL 37.5 MG PO CAPS: 37.5000 mg | ORAL_CAPSULE | ORAL | 0 refills | Status: DC

## 2020-01-03 MED ORDER — CYANOCOBALAMIN 1000 MCG/ML IJ SOLN
1000.0000 ug | Freq: Once | INTRAMUSCULAR | Status: AC
  Administered 2020-01-03: 1000 ug via INTRAMUSCULAR

## 2020-01-03 NOTE — Progress Notes (Signed)
Waist circumference: 38in.  °

## 2020-01-03 NOTE — Progress Notes (Signed)
GYN ENCOUNTER NOTE  Subjective:       Dawn Morris is a 29 y.o. G54P1011 female here for weight management visit.   No negative side effects from medications. Eating regularly and exercising.   Denies difficulty breathing or respiratory distress, chest pain, abdominal pain, excessive vaginal bleeding, dysuria, and leg pain or swelling.    Gynecologic History  Patient's last menstrual period was 12/27/2019 (exact date).  Contraception: OCP (estrogen/progesterone)  Last Pap: 08/2017. Results were: normal  Obstetric History  OB History  Gravida Para Term Preterm AB Living  2 1 1   1 1   SAB TAB Ectopic Multiple Live Births  1     0 1    # Outcome Date GA Lbr Len/2nd Weight Sex Delivery Anes PTL Lv  2 SAB 2018          1 Term 01/30/15 [redacted]w[redacted]d 11:52 / 01:14 8 lb 0.4 oz (3.64 kg) F Vag-Spont EPI  LIV     Birth Comments: none noted    Past Medical History:  Diagnosis Date  . Cancer ([place])   . Increased BMI     Past Surgical History:  Procedure Laterality Date  . ACHILLES TENDON SURGERY Right 12/06/2017   Procedure: ACHILLES TENDON REPAIR;  Surgeon: Earnestine Leys, MD;  Location: [facility] ORS;  Service: Orthopedics;  Laterality: Right;  . MELANOMA EXCISION    . TONSILLECTOMY  2005    Current Outpatient Medications on File Prior to Visit  Medication Sig Dispense Refill  . phentermine 37.5 MG capsule Take 1 capsule (37.5 mg total) by mouth every morning. 30 capsule 0  . Vitamin D, Ergocalciferol, (DRISDOL) 1.25 MG (50000 UNIT) CAPS capsule Take 1 capsule (50,000 Units total) by mouth every 7 (seven) days. 14 capsule 1  . gabapentin (NEURONTIN) 400 MG capsule gabapentin 400 mg capsule  TAKE 1 CAPSULE BY MOUTH THREE TIMES DAILY (Patient not taking: Reported on 01/03/2020)    . Norethin Ace-Eth Estrad-FE (TAYTULLA) 1-20 MG-MCG(24) CAPS Take 1 tablet by mouth daily. 28 capsule 3   No current facility-administered medications on file prior to visit.    No Known  Allergies  Social History   Socioeconomic History  . Marital status: Single    Spouse name: Not on file  . Number of children: Not on file  . Years of education: Not on file  . Highest education level: Not on file  Occupational History  . Not on file  Tobacco Use  . Smoking status: Never Smoker  . Smokeless tobacco: Never Used  Vaping Use  . Vaping Use: Never used  Substance and Sexual Activity  . Alcohol use: No  . Drug use: No  . Sexual activity: Yes    Birth control/protection: Pill  Other Topics Concern  . Not on file  Social History Narrative  . Not on file   Social Determinants of Health   Financial Resource Strain:   . Difficulty of Paying Living Expenses: Not on file  Food Insecurity:   . Worried About Charity fundraiser in the Last Year: Not on file  . Ran Out of Food in the Last Year: Not on file  Transportation Needs:   . Lack of Transportation (Medical): Not on file  . Lack of Transportation (Non-Medical): Not on file  Physical Activity:   . Days of Exercise per Week: Not on file  . Minutes of Exercise per Session: Not on file  Stress:   . Feeling of Stress : Not on file  Social Connections:   . Frequency of Communication with Friends and Family: Not on file  . Frequency of Social Gatherings with Friends and Family: Not on file  . Attends Religious Services: Not on file  . Active Member of Clubs or Organizations: Not on file  . Attends Archivist Meetings: Not on file  . Marital Status: Not on file  Intimate Partner Violence:   . Fear of Current or Ex-Partner: Not on file  . Emotionally Abused: Not on file  . Physically Abused: Not on file  . Sexually Abused: Not on file    Family History  Problem Relation Age of Onset  . Cancer Neg Hx   . Diabetes Neg Hx   . Heart disease Neg Hx   . Breast cancer Neg Hx   . Ovarian cancer Neg Hx   . Colon cancer Neg Hx     The following portions of the patient's history were reviewed and  updated as appropriate: allergies, current medications, past family history, past medical history, past social history, past surgical history and problem list.  Review of Systems  ROS negative except as noted above. Information obtained from patient.   Objective:   BP (!) 136/96   Pulse 80   Ht 5\' 6"  (1.676 m)   Wt 185 lb (83.9 kg)   LMP 12/27/2019 (Exact Date)   BMI 29.86 kg/m    CONSTITUTIONAL: Well-developed, well-nourished female in no acute distress.   WAIST CIRCUMFERENCE: 38 inches   Assessment:   1. Encounter for weight management   2. Blood pressure check   3. BMI 29.0-29.9,adult   4. Elevated triglycerides with high cholesterol   Plan:   Praise given for six (6) pound weight loss.   Rx Phentermine, see orders.   Agrees to keep blood pressure log for the next week.   Reviewed red flag symptoms and when to call.   RTC x 4 weeks for weight management visit or sooner if needed.    Dani Gobble, CNM [facility], [facility] 01/03/20 5:13 PM

## 2020-01-03 NOTE — Patient Instructions (Signed)

## 2020-01-06 ENCOUNTER — Ambulatory Visit: Payer: Medicaid Other | Admitting: Dietician

## 2020-01-24 ENCOUNTER — Telehealth: Payer: Self-pay | Admitting: Certified Nurse Midwife

## 2020-01-24 ENCOUNTER — Telehealth: Payer: Self-pay

## 2020-01-24 MED ORDER — NORETHIN ACE-ETH ESTRAD-FE 1-20 MG-MCG(24) PO TABS: 1.0000 | ORAL_TABLET | Freq: Every day | ORAL | 9 refills | Status: DC

## 2020-01-24 NOTE — Telephone Encounter (Signed)
Pt called in and stated that she needs to cancel her appt Monday the pt has an appt for Friday and wants to do everything at that one trip. I moved the pts appt to Friday

## 2020-01-24 NOTE — Telephone Encounter (Signed)
Rx

## 2020-01-27 ENCOUNTER — Encounter: Payer: Medicaid Other | Admitting: Certified Nurse Midwife

## 2020-01-31 ENCOUNTER — Encounter: Payer: Self-pay | Admitting: Certified Nurse Midwife

## 2020-01-31 ENCOUNTER — Other Ambulatory Visit: Payer: Self-pay

## 2020-01-31 ENCOUNTER — Encounter: Payer: Medicaid Other | Admitting: Certified Nurse Midwife

## 2020-01-31 ENCOUNTER — Ambulatory Visit (INDEPENDENT_AMBULATORY_CARE_PROVIDER_SITE_OTHER): Payer: Medicaid Other | Admitting: Certified Nurse Midwife

## 2020-01-31 VITALS — BP 157/106 | HR 78 | Wt 180.4 lb

## 2020-01-31 DIAGNOSIS — E782 Mixed hyperlipidemia: Secondary | ICD-10-CM

## 2020-01-31 DIAGNOSIS — E663 Overweight: Secondary | ICD-10-CM

## 2020-01-31 DIAGNOSIS — Z6829 Body mass index (BMI) 29.0-29.9, adult: Secondary | ICD-10-CM | POA: Diagnosis not present

## 2020-01-31 DIAGNOSIS — Z0130 Encounter for examination of blood pressure without abnormal findings: Secondary | ICD-10-CM

## 2020-01-31 DIAGNOSIS — Z7689 Persons encountering health services in other specified circumstances: Secondary | ICD-10-CM

## 2020-01-31 DIAGNOSIS — R030 Elevated blood-pressure reading, without diagnosis of hypertension: Secondary | ICD-10-CM

## 2020-01-31 MED ORDER — CYANOCOBALAMIN 1000 MCG/ML IJ SOLN
1000.0000 ug | Freq: Once | INTRAMUSCULAR | Status: AC
  Administered 2020-01-31: 1000 ug via INTRAMUSCULAR

## 2020-01-31 NOTE — Progress Notes (Signed)
Pt present for weight management. Administered B12.

## 2020-01-31 NOTE — Progress Notes (Signed)
GYN ENCOUNTER NOTE  Subjective:       Dawn Morris is a 29 y.o. G12P1011 female here for weight management visit; five (5) pounds lost in last month, 10 pounds in three (3) month time frame.  Reports loss of resident last month that impacted her eating and exercise regimen.   Denies negative side effects of medication. Notes normal blood pressures on home log.     Gynecologic History  No LMP recorded.  Contraception: OCP (estrogen/progesterone)  Last Pap: Up to date  Obstetric History  OB History  Gravida Para Term Preterm AB Living  2 1 1   1 1   SAB TAB Ectopic Multiple Live Births  1     0 1    # Outcome Date GA Lbr Len/2nd Weight Sex Delivery Anes PTL Lv  2 SAB 2018          1 Term 01/30/15 [redacted]w[redacted]d 11:52 / 01:14 8 lb 0.4 oz (3.64 kg) F Vag-Spont EPI  LIV     Birth Comments: none noted    Past Medical History:  Diagnosis Date  . Cancer ([place])   . Increased BMI     Past Surgical History:  Procedure Laterality Date  . ACHILLES TENDON SURGERY Right 12/06/2017   Procedure: ACHILLES TENDON REPAIR;  Surgeon: Earnestine Leys, MD;  Location: [facility] ORS;  Service: Orthopedics;  Laterality: Right;  . MELANOMA EXCISION    . TONSILLECTOMY  2005    Current Outpatient Medications on File Prior to Visit  Medication Sig Dispense Refill  . Norethindrone Acetate-Ethinyl Estrad-FE (LOESTRIN 24 FE) 1-20 MG-MCG(24) tablet Take 1 tablet by mouth daily. 28 tablet 9  . phentermine 37.5 MG capsule Take 1 capsule (37.5 mg total) by mouth every morning. 30 capsule 0  . Vitamin D, Ergocalciferol, (DRISDOL) 1.25 MG (50000 UNIT) CAPS capsule Take 1 capsule (50,000 Units total) by mouth every 7 (seven) days. 14 capsule 1  . phentermine 37.5 MG capsule Take 1 capsule (37.5 mg total) by mouth every morning. 30 capsule 0   No current facility-administered medications on file prior to visit.    No Known Allergies  Social History   Socioeconomic History  . Marital status: Single    Spouse  name: Not on file  . Number of children: Not on file  . Years of education: Not on file  . Highest education level: Not on file  Occupational History  . Not on file  Tobacco Use  . Smoking status: Never Smoker  . Smokeless tobacco: Never Used  Vaping Use  . Vaping Use: Never used  Substance and Sexual Activity  . Alcohol use: No  . Drug use: No  . Sexual activity: Yes    Birth control/protection: Pill  Other Topics Concern  . Not on file  Social History Narrative  . Not on file   Social Determinants of Health   Financial Resource Strain:   . Difficulty of Paying Living Expenses: Not on file  Food Insecurity:   . Worried About Charity fundraiser in the Last Year: Not on file  . Ran Out of Food in the Last Year: Not on file  Transportation Needs:   . Lack of Transportation (Medical): Not on file  . Lack of Transportation (Non-Medical): Not on file  Physical Activity:   . Days of Exercise per Week: Not on file  . Minutes of Exercise per Session: Not on file  Stress:   . Feeling of Stress : Not on file  Social  Connections:   . Frequency of Communication with Friends and Family: Not on file  . Frequency of Social Gatherings with Friends and Family: Not on file  . Attends Religious Services: Not on file  . Active Member of Clubs or Organizations: Not on file  . Attends Archivist Meetings: Not on file  . Marital Status: Not on file  Intimate Partner Violence:   . Fear of Current or Ex-Partner: Not on file  . Emotionally Abused: Not on file  . Physically Abused: Not on file  . Sexually Abused: Not on file    Family History  Problem Relation Age of Onset  . Cancer Neg Hx   . Diabetes Neg Hx   . Heart disease Neg Hx   . Breast cancer Neg Hx   . Ovarian cancer Neg Hx   . Colon cancer Neg Hx     The following portions of the patient's history were reviewed and updated as appropriate: allergies, current medications, past family history, past medical history,  past social history, past surgical history and problem list.  Review of Systems  ROS negative except as noted above. Information obtained from patient.   Objective:   BP (!) 157/106   Pulse 78   Wt 180 lb 6.4 oz (81.8 kg)   BMI 29.12 kg/m    CONSTITUTIONAL: Well-developed, well-nourished female in no acute distress.   ABDOMEN: Waist circumference 35 inches.   Assessment:   1. BMI 29.0-29.9,adult   2. Encounter for weight management   3. Blood pressure check   4. Elevated triglycerides with high cholesterol   5. Elevated blood pressure reading in office without diagnosis of hypertension   Plan:   Advised to discontinue phentermine at this time.   Encouraged to keep blood pressure log.   B-12 injection given today, see chart.   Reviewed red flag symptoms and when to call.   RTC x 1-2 weeks for blood pressure check or sooner if needed.    Dani Gobble, CNM [facility], [facility] 01/31/20 10:42 AM  I spent 15 minutes dedicated to te care of this patient on the date of this encounter to include pre-visit review of records, face to face time with patient, and post visit ordering of testing.

## 2020-01-31 NOTE — Patient Instructions (Addendum)
Calorie Counting for Weight Loss Calories are units of energy. Your body needs a certain amount of calories from food to keep you going throughout the day. When you eat more calories than your body needs, your body stores the extra calories as fat. When you eat fewer calories than your body needs, your body burns fat to get the energy it needs. Calorie counting means keeping track of how many calories you eat and drink each day. Calorie counting can be helpful if you need to lose weight. If you make sure to eat fewer calories than your body needs, you should lose weight. Ask your health care provider what a healthy weight is for you. For calorie counting to work, you will need to eat the right number of calories in a day in order to lose a healthy amount of weight per week. A dietitian can help you determine how many calories you need in a day and will give you suggestions on how to reach your calorie goal.  A healthy amount of weight to lose per week is usually 1-2 lb (0.5-0.9 kg). This usually means that your daily calorie intake should be reduced by 500-750 calories.  Eating 1,200 - 1,500 calories per day can help most women lose weight.  Eating 1,500 - 1,800 calories per day can help most men lose weight. What is my plan? My goal is to have __________ calories per day. If I have this many calories per day, I should lose around __________ pounds per week. What do I need to know about calorie counting? In order to meet your daily calorie goal, you will need to:  Find out how many calories are in each food you would like to eat. Try to do this before you eat.  Decide how much of the food you plan to eat.  Write down what you ate and how many calories it had. Doing this is called keeping a food log. To successfully lose weight, it is important to balance calorie counting with a healthy lifestyle that includes regular activity. Aim for 150 minutes of moderate exercise (such as walking) or 75  minutes of vigorous exercise (such as running) each week. Where do I find calorie information?  The number of calories in a food can be found on a Nutrition Facts label. If a food does not have a Nutrition Facts label, try to look up the calories online or ask your dietitian for help. Remember that calories are listed per serving. If you choose to have more than one serving of a food, you will have to multiply the calories per serving by the amount of servings you plan to eat. For example, the label on a package of bread might say that a serving size is 1 slice and that there are 90 calories in a serving. If you eat 1 slice, you will have eaten 90 calories. If you eat 2 slices, you will have eaten 180 calories. How do I keep a food log? Immediately after each meal, record the following information in your food log:  What you ate. Don't forget to include toppings, sauces, and other extras on the food.  How much you ate. This can be measured in cups, ounces, or number of items.  How many calories each food and drink had.  The total number of calories in the meal. Keep your food log near you, such as in a small notebook in your pocket, or use a mobile app or website. Some programs will calculate  calories for you and show you how many calories you have left for the day to meet your goal. What are some calorie counting tips?   Use your calories on foods and drinks that will fill you up and not leave you hungry: ? Some examples of foods that fill you up are nuts and nut butters, vegetables, lean proteins, and high-fiber foods like whole grains. High-fiber foods are foods with more than 5 g fiber per serving. ? Drinks such as sodas, specialty coffee drinks, alcohol, and juices have a lot of calories, yet do not fill you up.  Eat nutritious foods and avoid empty calories. Empty calories are calories you get from foods or beverages that do not have many vitamins or protein, such as candy, sweets, and  soda. It is better to have a nutritious high-calorie food (such as an avocado) than a food with few nutrients (such as a bag of chips).  Know how many calories are in the foods you eat most often. This will help you calculate calorie counts faster.  Pay attention to calories in drinks. Low-calorie drinks include water and unsweetened drinks.  Pay attention to nutrition labels for "low fat" or "fat free" foods. These foods sometimes have the same amount of calories or more calories than the full fat versions. They also often have added sugar, starch, or salt, to make up for flavor that was removed with the fat.  Find a way of tracking calories that works for you. Get creative. Try different apps or programs if writing down calories does not work for you. What are some portion control tips?  Know how many calories are in a serving. This will help you know how many servings of a certain food you can have.  Use a measuring cup to measure serving sizes. You could also try weighing out portions on a kitchen scale. With time, you will be able to estimate serving sizes for some foods.  Take some time to put servings of different foods on your favorite plates, bowls, and cups so you know what a serving looks like.  Try not to eat straight from a bag or box. Doing this can lead to overeating. Put the amount you would like to eat in a cup or on a plate to make sure you are eating the right portion.  Use smaller plates, glasses, and bowls to prevent overeating.  Try not to multitask (for example, watch TV or use your computer) while eating. If it is time to eat, sit down at a table and enjoy your food. This will help you to know when you are full. It will also help you to be aware of what you are eating and how much you are eating. What are tips for following this plan? Reading food labels  Check the calorie count compared to the serving size. The serving size may be smaller than what you are used to  eating.  Check the source of the calories. Make sure the food you are eating is high in vitamins and protein and low in saturated and trans fats. Shopping  Read nutrition labels while you shop. This will help you make healthy decisions before you decide to purchase your food.  Make a grocery list and stick to it. Cooking  Try to cook your favorite foods in a healthier way. For example, try baking instead of frying.  Use low-fat dairy products. Meal planning  Use more fruits and vegetables. Half of your plate should be fruits  and vegetables.  Include lean proteins like poultry and fish. How do I count calories when eating out?  Ask for smaller portion sizes.  Consider sharing an entree and sides instead of getting your own entree.  If you get your own entree, eat only half. Ask for a box at the beginning of your meal and put the rest of your entree in it so you are not tempted to eat it.  If calories are listed on the menu, choose the lower calorie options.  Choose dishes that include vegetables, fruits, whole grains, low-fat dairy products, and lean protein.  Choose items that are boiled, broiled, grilled, or steamed. Stay away from items that are buttered, battered, fried, or served with cream sauce. Items labeled "crispy" are usually fried, unless stated otherwise.  Choose water, low-fat milk, unsweetened iced tea, or other drinks without added sugar. If you want an alcoholic beverage, choose a lower calorie option such as a glass of wine or light beer.  Ask for dressings, sauces, and syrups on the side. These are usually high in calories, so you should limit the amount you eat.  If you want a salad, choose a garden salad and ask for grilled meats. Avoid extra toppings like bacon, cheese, or fried items. Ask for the dressing on the side, or ask for olive oil and vinegar or lemon to use as dressing.  Estimate how many servings of a food you are given. For example, a serving of  cooked rice is  cup or about the size of half a baseball. Knowing serving sizes will help you be aware of how much food you are eating at restaurants. The list below tells you how big or small some common portion sizes are based on everyday objects: ? 1 oz--4 stacked dice. ? 3 oz--1 deck of cards. ? 1 tsp--1 die. ? 1 Tbsp-- a ping-pong ball. ? 2 Tbsp--1 ping-pong ball. ?  cup-- baseball. ? 1 cup--1 baseball. Summary  Calorie counting means keeping track of how many calories you eat and drink each day. If you eat fewer calories than your body needs, you should lose weight.  A healthy amount of weight to lose per week is usually 1-2 lb (0.5-0.9 kg). This usually means reducing your daily calorie intake by 500-750 calories.  The number of calories in a food can be found on a Nutrition Facts label. If a food does not have a Nutrition Facts label, try to look up the calories online or ask your dietitian for help.  Use your calories on foods and drinks that will fill you up, and not on foods and drinks that will leave you hungry.  Use smaller plates, glasses, and bowls to prevent overeating. This information is not intended to replace advice given to you by your health care provider. Make sure you discuss any questions you have with your health care provider. Document Revised: 01/19/2018 Document Reviewed: 04/01/2016 Elsevier Patient Education  2020 [place] 13-77 Years Old, Female Preventive care refers to visits with your health care provider and lifestyle choices that can promote health and wellness. This includes:  A yearly physical exam. This may also be called an annual well check.  Regular dental visits and eye exams.  Immunizations.  Screening for certain conditions.  Healthy lifestyle choices, such as eating a healthy diet, getting regular exercise, not using drugs or products that contain nicotine and tobacco, and limiting alcohol use. What can I  expect for my preventive  care visit? Physical exam Your health care provider will check your:  Height and weight. This may be used to calculate body mass index (BMI), which tells if you are at a healthy weight.  Heart rate and blood pressure.  Skin for abnormal spots. Counseling Your health care provider may ask you questions about your:  Alcohol, tobacco, and drug use.  Emotional well-being.  Home and relationship well-being.  Sexual activity.  Eating habits.  Work and work Statistician.  Method of birth control.  Menstrual cycle.  Pregnancy history. What immunizations do I need?  Influenza (flu) vaccine  This is recommended every year. Tetanus, diphtheria, and pertussis (Tdap) vaccine  You may need a Td booster every 10 years. Varicella (chickenpox) vaccine  You may need this if you have not been vaccinated. Human papillomavirus (HPV) vaccine  If recommended by your health care provider, you may need three doses over 6 months. Measles, mumps, and rubella (MMR) vaccine  You may need at least one dose of MMR. You may also need a second dose. Meningococcal conjugate (MenACWY) vaccine  One dose is recommended if you are age 68-21 years and a first-year college student living in a residence hall, or if you have one of several medical conditions. You may also need additional booster doses. Pneumococcal conjugate (PCV13) vaccine  You may need this if you have certain conditions and were not previously vaccinated. Pneumococcal polysaccharide (PPSV23) vaccine  You may need one or two doses if you smoke cigarettes or if you have certain conditions. Hepatitis A vaccine  You may need this if you have certain conditions or if you travel or work in places where you may be exposed to hepatitis A. Hepatitis B vaccine  You may need this if you have certain conditions or if you travel or work in places where you may be exposed to hepatitis B. Haemophilus influenzae type b  (Hib) vaccine  You may need this if you have certain conditions. You may receive vaccines as individual doses or as more than one vaccine together in one shot (combination vaccines). Talk with your health care provider about the risks and benefits of combination vaccines. What tests do I need?  Blood tests  Lipid and cholesterol levels. These may be checked every 5 years starting at age 14.  Hepatitis C test.  Hepatitis B test. Screening  Diabetes screening. This is done by checking your blood sugar (glucose) after you have not eaten for a while (fasting).  Sexually transmitted disease (STD) testing.  BRCA-related cancer screening. This may be done if you have a family history of breast, ovarian, tubal, or peritoneal cancers.  Pelvic exam and Pap test. This may be done every 3 years starting at age 66. Starting at age 66, this may be done every 5 years if you have a Pap test in combination with an HPV test. Talk with your health care provider about your test results, treatment options, and if necessary, the need for more tests. Follow these instructions at home: Eating and drinking   Eat a diet that includes fresh fruits and vegetables, whole grains, lean protein, and low-fat dairy.  Take vitamin and mineral supplements as recommended by your health care provider.  Do not drink alcohol if: ? Your health care provider tells you not to drink. ? You are pregnant, may be pregnant, or are planning to become pregnant.  If you drink alcohol: ? Limit how much you have to 0-1 drink a day. ? Be aware of how  much alcohol is in your drink. In the [place] one drink equals one 12 oz bottle of beer (355 mL), one 5 oz glass of wine (148 mL), or one 1 oz glass of hard liquor (44 mL). Lifestyle  Take daily care of your teeth and gums.  Stay active. Exercise for at least 30 minutes on 5 or more days each week.  Do not use any products that contain nicotine or tobacco, such as cigarettes,  e-cigarettes, and chewing tobacco. If you need help quitting, ask your health care provider.  If you are sexually active, practice safe sex. Use a condom or other form of birth control (contraception) in order to prevent pregnancy and STIs (sexually transmitted infections). If you plan to become pregnant, see your health care provider for a preconception visit. What's next?  Visit your health care provider once a year for a well check visit.  Ask your health care provider how often you should have your eyes and teeth checked.  Stay up to date on all vaccines. This information is not intended to replace advice given to you by your health care provider. Make sure you discuss any questions you have with your health care provider. Document Revised: 01/11/2018 Document Reviewed: 01/11/2018 Elsevier Patient Education  [place].  Hypertension, Adult Hypertension is another name for high blood pressure. High blood pressure forces your heart to work harder to pump blood. This can cause problems over time. There are two numbers in a blood pressure reading. There is a top number (systolic) over a bottom number (diastolic). It is best to have a blood pressure that is below 120/80. Healthy choices can help lower your blood pressure, or you may need medicine to help lower it. What are the causes? The cause of this condition is not known. Some conditions may be related to high blood pressure. What increases the risk?  Smoking.  Having type 2 diabetes mellitus, high cholesterol, or both.  Not getting enough exercise or physical activity.  Being overweight.  Having too much fat, sugar, calories, or salt (sodium) in your diet.  Drinking too much alcohol.  Having long-term (chronic) kidney disease.  Having a family history of high blood pressure.  Age. Risk increases with age.  Race. You may be at higher risk if you are African American.  Gender. Men are at higher risk than women  before age 64. After age 97, women are at higher risk than men.  Having obstructive sleep apnea.  Stress. What are the signs or symptoms?  High blood pressure may not cause symptoms. Very high blood pressure (hypertensive crisis) may cause: ? Headache. ? Feelings of worry or nervousness (anxiety). ? Shortness of breath. ? Nosebleed. ? A feeling of being sick to your stomach (nausea). ? Throwing up (vomiting). ? Changes in how you see. ? Very bad chest pain. ? Seizures. How is this treated?  This condition is treated by making healthy lifestyle changes, such as: ? Eating healthy foods. ? Exercising more. ? Drinking less alcohol.  Your health care provider may prescribe medicine if lifestyle changes are not enough to get your blood pressure under control, and if: ? Your top number is above 130. ? Your bottom number is above 80.  Your personal target blood pressure may vary. Follow these instructions at home: Eating and drinking   If told, follow the DASH eating plan. To follow this plan: ? Fill one half of your plate at each meal with fruits and vegetables. ? Fill  one fourth of your plate at each meal with whole grains. Whole grains include whole-wheat pasta, brown rice, and whole-grain bread. ? Eat or drink low-fat dairy products, such as skim milk or low-fat yogurt. ? Fill one fourth of your plate at each meal with low-fat (lean) proteins. Low-fat proteins include fish, chicken without skin, eggs, beans, and tofu. ? Avoid fatty meat, cured and processed meat, or chicken with skin. ? Avoid pre-made or processed food.  Eat less than 1,500 mg of salt each day.  Do not drink alcohol if: ? Your doctor tells you not to drink. ? You are pregnant, may be pregnant, or are planning to become pregnant.  If you drink alcohol: ? Limit how much you use to:  0-1 drink a day for women.  0-2 drinks a day for men. ? Be aware of how much alcohol is in your drink. In the [place] one  drink equals one 12 oz bottle of beer (355 mL), one 5 oz glass of wine (148 mL), or one 1 oz glass of hard liquor (44 mL). Lifestyle   Work with your doctor to stay at a healthy weight or to lose weight. Ask your doctor what the best weight is for you.  Get at least 30 minutes of exercise most days of the week. This may include walking, swimming, or biking.  Get at least 30 minutes of exercise that strengthens your muscles (resistance exercise) at least 3 days a week. This may include lifting weights or doing Pilates.  Do not use any products that contain nicotine or tobacco, such as cigarettes, e-cigarettes, and chewing tobacco. If you need help quitting, ask your doctor.  Check your blood pressure at home as told by your doctor.  Keep all follow-up visits as told by your doctor. This is important. Medicines  Take over-the-counter and prescription medicines only as told by your doctor. Follow directions carefully.  Do not skip doses of blood pressure medicine. The medicine does not work as well if you skip doses. Skipping doses also puts you at risk for problems.  Ask your doctor about side effects or reactions to medicines that you should watch for. Contact a doctor if you:  Think you are having a reaction to the medicine you are taking.  Have headaches that keep coming back (recurring).  Feel dizzy.  Have swelling in your ankles.  Have trouble with your vision. Get help right away if you:  Get a very bad headache.  Start to feel mixed up (confused).  Feel weak or numb.  Feel faint.  Have very bad pain in your: ? Chest. ? Belly (abdomen).  Throw up more than once.  Have trouble breathing. Summary  Hypertension is another name for high blood pressure.  High blood pressure forces your heart to work harder to pump blood.  For most people, a normal blood pressure is less than 120/80.  Making healthy choices can help lower blood pressure. If your blood pressure  does not get lower with healthy choices, you may need to take medicine. This information is not intended to replace advice given to you by your health care provider. Make sure you discuss any questions you have with your health care provider. Document Revised: 01/10/2018 Document Reviewed: 01/10/2018 Elsevier Patient Education  2020 [employer].

## 2020-02-07 ENCOUNTER — Ambulatory Visit (INDEPENDENT_AMBULATORY_CARE_PROVIDER_SITE_OTHER): Payer: Medicaid Other

## 2020-02-07 ENCOUNTER — Other Ambulatory Visit: Payer: Self-pay

## 2020-02-07 VITALS — BP 122/74 | HR 80 | Ht 66.0 in | Wt 182.2 lb

## 2020-02-07 DIAGNOSIS — Z0130 Encounter for examination of blood pressure without abnormal findings: Secondary | ICD-10-CM

## 2020-02-07 NOTE — Progress Notes (Signed)
Pt present for BP check. Pt stated that she was doing well. No problems.

## 2020-02-13 ENCOUNTER — Other Ambulatory Visit: Payer: Self-pay | Admitting: Certified Nurse Midwife

## 2020-02-13 DIAGNOSIS — Z6829 Body mass index (BMI) 29.0-29.9, adult: Secondary | ICD-10-CM

## 2020-02-13 DIAGNOSIS — Z87440 Personal history of urinary (tract) infections: Secondary | ICD-10-CM

## 2020-02-13 MED ORDER — NITROFURANTOIN MONOHYD MACRO 100 MG PO CAPS: 100.0000 mg | ORAL_CAPSULE | Freq: Two times a day (BID) | ORAL | 1 refills | Status: DC

## 2020-02-13 MED ORDER — PHENTERMINE HCL 37.5 MG PO CAPS: 37.5000 mg | ORAL_CAPSULE | ORAL | 0 refills | Status: DC

## 2020-02-13 NOTE — Progress Notes (Signed)
Rx Phentermine and Macrobid, see orders.    Dani Gobble, CNM [facility], [facility] 02/13/20 5:43 PM

## 2020-05-16 NOTE — L&D Delivery Note (Addendum)
° ° °     Delivery Note   KABREA SEENEY is a 30 y.o. G3P1011 at [redacted]w[redacted]d Estimated Date of Delivery: 05/31/21  PRE-OPERATIVE DIAGNOSIS:  1) [redacted]w[redacted]d pregnancy.    POST-OPERATIVE DIAGNOSIS:  1) [redacted]w[redacted]d pregnancy s/p Vaginal, Spontaneous    Delivery Type: Vaginal, Spontaneous    Delivery Anesthesia:  Epidural  Labor Complications:   none    ESTIMATED BLOOD LOSS: 740  ml    FINDINGS:   1) female infant, Apgar scores of 6   at 1 minute and 8   at 5 minutes and a birthweight of 116.4  ounces.    2) Nuchal cord: Yes, reduced   SPECIMENS:   PLACENTA:   Appearance: Intact , 3 vessel cord, Cord blood sample collected   Removal: Spontaneous      Disposition:   per protocol   DISPOSITION:  Infant to left in stable condition in the delivery room, with L&D personnel and mother,  NARRATIVE SUMMARY: Labor course:  Ms. MISCHELLE REEG is a [id number] at [redacted]w[redacted]d who presented for induction of labor.  She progressed well in labor with pitocin.  She received the appropriate epidural anesthesia and proceeded to complete dilation. She evidenced good maternal expulsive effort during the second stage. She went on to deliver a viable female infant ".Soyla Dryer" The placenta delivered without problems and was noted to be complete. A perineal and vaginal examination was performed. Episiotomy/Lacerations: Bilateral periurethral abrasions, hemostatic not in need of repair. The patient tolerated this well. Pt had moderate to heavy bleeding , 800 mcg Cytotec place rectally. Bimanual exam completed for removal of several clots, bleeding improved. Continue to monitor.   [name], CNM  05/12/2021 3:26 PM

## 2020-06-11 ENCOUNTER — Other Ambulatory Visit: Payer: Medicaid Other

## 2020-06-11 ENCOUNTER — Other Ambulatory Visit: Payer: Self-pay | Admitting: Certified Nurse Midwife

## 2020-06-11 ENCOUNTER — Telehealth: Payer: Self-pay

## 2020-06-11 DIAGNOSIS — Z20822 Contact with and (suspected) exposure to covid-19: Secondary | ICD-10-CM

## 2020-06-11 DIAGNOSIS — N926 Irregular menstruation, unspecified: Secondary | ICD-10-CM

## 2020-06-11 NOTE — Telephone Encounter (Signed)
Please see patient message. Thanks, JML

## 2020-06-11 NOTE — Telephone Encounter (Signed)
Please contact patient to schedule lab visit for this afternoon. Thanks, JML

## 2020-06-11 NOTE — Telephone Encounter (Signed)
Called pt Dawn Morris, Sharyn Lull would like the pt to come in for a lab today, I put the pt down for 2:15 pm

## 2020-06-11 NOTE — Progress Notes (Signed)
Beta ordered, see chart.    Dani Gobble, CNM [facility], [facility] 06/11/20 11:17 AM

## 2020-06-12 LAB — SARS-COV-2, NAA 2 DAY TAT

## 2020-06-12 LAB — NOVEL CORONAVIRUS, NAA: SARS-CoV-2, NAA: NOT DETECTED

## 2020-06-12 NOTE — Telephone Encounter (Signed)
Please check with patient did she have rapid or PCR? May come to the office later this afternoon for labs if test was negative. Thanks, JML

## 2020-06-15 ENCOUNTER — Other Ambulatory Visit: Payer: Medicaid Other

## 2020-06-15 ENCOUNTER — Other Ambulatory Visit: Payer: Self-pay

## 2020-06-16 LAB — BETA HCG QUANT (REF LAB): hCG Quant: <1 m[IU]/mL

## 2020-06-19 ENCOUNTER — Other Ambulatory Visit: Payer: Medicaid Other

## 2020-09-24 ENCOUNTER — Encounter: Payer: Self-pay | Admitting: Certified Nurse Midwife

## 2020-09-24 ENCOUNTER — Ambulatory Visit (INDEPENDENT_AMBULATORY_CARE_PROVIDER_SITE_OTHER): Payer: Medicaid Other | Admitting: Certified Nurse Midwife

## 2020-09-24 ENCOUNTER — Other Ambulatory Visit: Payer: Self-pay

## 2020-09-24 VITALS — BP 125/86 | HR 64 | Resp 16 | Ht 66.0 in | Wt 188.3 lb

## 2020-09-24 DIAGNOSIS — Z8759 Personal history of other complications of pregnancy, childbirth and the puerperium: Secondary | ICD-10-CM | POA: Diagnosis not present

## 2020-09-24 DIAGNOSIS — O3680X0 Pregnancy with inconclusive fetal viability, not applicable or unspecified: Secondary | ICD-10-CM | POA: Diagnosis not present

## 2020-09-24 DIAGNOSIS — Z3201 Encounter for pregnancy test, result positive: Secondary | ICD-10-CM

## 2020-09-24 LAB — POCT URINE PREGNANCY: Preg Test, Ur: POSITIVE — AB

## 2020-09-24 NOTE — Patient Instructions (Signed)
WHAT OB PATIENTS CAN EXPECT   Confirmation of pregnancy and ultrasound ordered if medically indicated-[redacted] weeks gestation  New OB (NOB) intake with nurse and New OB (NOB) labs- [redacted] weeks gestation  New OB (NOB) physical examination with provider- 11/[redacted] weeks gestation  Flu vaccine-[redacted] weeks gestation  Anatomy scan-[redacted] weeks gestation  Glucose tolerance test, blood work to test for anemia, T-dap vaccine-[redacted] weeks gestation  Vaginal swabs/cultures-STD/Group B strep-[redacted] weeks gestation  Appointments every 4 weeks until 28 weeks  Every 2 weeks from 28 weeks until 36 weeks  Weekly visits from 36 weeks until delivery    Common Medications Safe in Pregnancy  Acne:      Constipation:  Benzoyl Peroxide     Colace  Clindamycin      Dulcolax Suppository  Topica Erythromycin     Fibercon  Salicylic Acid      Metamucil         Miralax AVOID:        Senakot   Accutane    Cough:  Retin-A       Cough Drops  Tetracycline      Phenergan w/ Codeine if Rx  Minocycline      Robitussin (Plain & DM)  Antibiotics:     Crabs/Lice:  Ceclor       RID  Cephalosporins    AVOID:  E-Mycins      Kwell  Keflex  Macrobid/Macrodantin   Diarrhea:  Penicillin      Kao-Pectate  Zithromax      Imodium AD         PUSH FLUIDS AVOID:       Cipro     Fever:  Tetracycline      Tylenol (Regular or Extra  Minocycline       Strength)  Levaquin      Extra Strength-Do not          Exceed 8 tabs/24 hrs Caffeine:        <298m/day (equiv. To 1 cup of coffee or  approx. 3 12 oz sodas)         Gas: Cold/Hayfever:       Gas-X  Benadryl      Mylicon  Claritin       Phazyme  **Claritin-D        Chlor-Trimeton    Headaches:  Dimetapp      ASA-Free Excedrin  Drixoral-Non-Drowsy     Cold Compress  Mucinex (Guaifenasin)     Tylenol (Regular or Extra  Sudafed/Sudafed-12 Hour     Strength)  **Sudafed PE Pseudoephedrine   Tylenol Cold & Sinus     Vicks Vapor Rub  Zyrtec  **AVOID if Problems With Blood  Pressure         Heartburn: Avoid lying down for at least 1 hour after meals  Aciphex      Maalox     Rash:  Milk of Magnesia     Benadryl    Mylanta       1% Hydrocortisone Cream  Pepcid  Pepcid Complete   Sleep Aids:  Prevacid      Ambien   Prilosec       Benadryl  Rolaids       Chamomile Tea  Tums (Limit 4/day)     Unisom         Tylenol PM         Warm milk-add vanilla or  Hemorrhoids:       Sugar for taste  Anusol/Anusol H.C.  (RX:  Analapram 2.5%)  Sugar Substitutes:  Hydrocortisone OTC     Ok in moderation  Preparation H      Tucks        Vaseline lotion applied to tissue with wiping    Herpes:     Throat:  Acyclovir      Oragel  Famvir  Valtrex     Vaccines:         Flu Shot Leg Cramps:       *Gardasil  Benadryl      Hepatitis A         Hepatitis B Nasal Spray:       Pneumovax  Saline Nasal Spray     Polio Booster         Tetanus Nausea:       Tuberculosis test or PPD  Vitamin B6 25 mg TID   AVOID:    Dramamine      *Gardasil  Emetrol       Live Poliovirus  Ginger Root 250 mg QID    MMR (measles, mumps &  High Complex Carbs @ Bedtime    rebella)  Sea Bands-Accupressure    Varicella (Chickenpox)  Unisom 1/2 tab TID     *No known complications           If received before Pain:         Known pregnancy;   Darvocet       Resume series after  Lortab        Delivery  Percocet    Yeast:   Tramadol      Femstat  Tylenol 3      Gyne-lotrimin  Ultram       Monistat  Vicodin           MISC:         All Sunscreens           Hair Coloring/highlights          Insect Repellant's          (Including DEET)         Mystic Tans   https://www.cdc.gov/pregnancy/infections.html">  First Trimester of Pregnancy  The first trimester of pregnancy starts on the first day of your last menstrual period until the end of week 12. This is also called months 1 through 3 of pregnancy. Body changes during your first trimester Your body goes through many changes during  pregnancy. The changes usually return to normal after your baby is born. Physical changes  You may gain or lose weight.  Your breasts may grow larger and hurt. The area around your nipples may get darker.  Dark spots or blotches may develop on your face.  You may have changes in your hair. Health changes  You may feel like you might vomit (nauseous), and you may vomit.  You may have heartburn.  You may have headaches.  You may have trouble pooping (constipation).  Your gums may bleed. Other changes  You may get tired easily.  You may pee (urinate) more often.  Your menstrual periods will stop.  You may not feel hungry.  You may want to eat certain kinds of food.  You may have changes in your emotions from day to day.  You may have more dreams. Follow these instructions at home: Medicines  Take over-the-counter and prescription medicines only as told by your doctor. Some medicines are not safe during pregnancy.  Take a prenatal vitamin that contains at least 600 micrograms (mcg) of folic acid.   Eating and drinking  Eat healthy meals that include: ? Fresh fruits and vegetables. ? Whole grains. ? Good sources of protein, such as meat, eggs, or tofu. ? Low-fat dairy products.  Avoid raw meat and unpasteurized juice, milk, and cheese.  If you feel like you may vomit, or you vomit: ? Eat 4 or 5 small meals a day instead of 3 large meals. ? Try eating a few soda crackers. ? Drink liquids between meals instead of during meals.  You may need to take these actions to prevent or treat trouble pooping: ? Drink enough fluids to keep your pee (urine) pale yellow. ? Eat foods that are high in fiber. These include beans, whole grains, and fresh fruits and vegetables. ? Limit foods that are high in fat and sugar. These include fried or sweet foods. Activity  Exercise only as told by your doctor. Most people can do their usual exercise routine during pregnancy.  Stop  exercising if you have cramps or pain in your lower belly (abdomen) or low back.  Do not exercise if it is too hot or too humid, or if you are in a place of great height (high altitude).  Avoid heavy lifting.  If you choose to, you may have sex unless your doctor tells you not to. Relieving pain and discomfort  Wear a good support bra if your breasts are sore.  Rest with your legs raised (elevated) if you have leg cramps or low back pain.  If you have bulging veins (varicose veins) in your legs: ? Wear support hose as told by your doctor. ? Raise your feet for 15 minutes, 3-4 times a day. ? Limit salt in your food. Safety  Wear your seat belt at all times when you are in a car.  Talk with your doctor if someone is hurting you or yelling at you.  Talk with your doctor if you are feeling sad or have thoughts of hurting yourself. Lifestyle  Do not use hot tubs, steam rooms, or saunas.  Do not douche. Do not use tampons or scented sanitary pads.  Do not use herbal medicines, illegal drugs, or medicines that are not approved by your doctor. Do not drink alcohol.  Do not smoke or use any products that contain nicotine or tobacco. If you need help quitting, ask your doctor.  Avoid cat litter boxes and soil that is used by cats. These carry germs that can cause harm to the baby and can cause a loss of your baby by miscarriage or stillbirth. General instructions  Keep all follow-up visits. This is important.  Ask for help if you need counseling or if you need help with nutrition. Your doctor can give you advice or tell you where to go for help.  Visit your dentist. At home, brush your teeth with a soft toothbrush. Floss gently.  Write down your questions. Take them to your prenatal visits. Where to find more information  American Pregnancy Association: americanpregnancy.org  American College of Obstetricians and Gynecologists: www.acog.org  Office on Women's Health:  womenshealth.gov/pregnancy Contact a doctor if:  You are dizzy.  You have a fever.  You have mild cramps or pressure in your lower belly.  You have a nagging pain in your belly area.  You continue to feel like you may vomit, you vomit, or you have watery poop (diarrhea) for 24 hours or longer.  You have a bad-smelling fluid coming from your vagina.  You have pain when you pee.  You   are exposed to a disease that spreads from person to person, such as chickenpox, measles, Zika virus, HIV, or hepatitis. Get help right away if:  You have spotting or bleeding from your vagina.  You have very bad belly cramping or pain.  You have shortness of breath or chest pain.  You have any kind of injury, such as from a fall or a car crash.  You have new or increased pain, swelling, or redness in an arm or leg. Summary  The first trimester of pregnancy starts on the first day of your last menstrual period until the end of week 12 (months 1 through 3).  Eat 4 or 5 small meals a day instead of 3 large meals.  Do not smoke or use any products that contain nicotine or tobacco. If you need help quitting, ask your doctor.  Keep all follow-up visits. This information is not intended to replace advice given to you by your health care provider. Make sure you discuss any questions you have with your health care provider. Document Revised: 10/09/2019 Document Reviewed: 08/15/2019 Elsevier Patient Education  2021 [employer].  

## 2020-09-24 NOTE — Progress Notes (Signed)
GYN ENCOUNTER NOTE  Subjective:       Dawn Morris is a 30 y.o. G39P1011 female here for pregnancy confirmation.   Reports missed period and intermittent abdominal cramping.   Taking prenatal vitamin with folic acid and DHA.   History significant for miscarriage.   Denies difficulty breathing or respiratory distress, chest pain, dysuria and leg pain or swelling.    Gynecologic History  Patient's last menstrual period was 08/14/2020.  Estimated Date of Birth: 05/21/2022.   Gestational age: 58 weeks 6 days  Contraception: none  Last Pap: 08/2017. Results were: normal  Obstetric History  OB History  Gravida Para Term Preterm AB Living  3 1 1   1 1   SAB IAB Ectopic Multiple Live Births  1     0 1    # Outcome Date GA Lbr Len/2nd Weight Sex Delivery Anes PTL Lv  3 Current           2 SAB 2018          1 Term 01/30/15 [redacted]w[redacted]d 11:52 / 01:14 8 lb 0.4 oz (3.64 kg) F Vag-Spont EPI  LIV     Birth Comments: none noted    Past Medical History:  Diagnosis Date  . Cancer ([place])   . Increased BMI     Past Surgical History:  Procedure Laterality Date  . ACHILLES TENDON SURGERY Right 12/06/2017   Procedure: ACHILLES TENDON REPAIR;  Surgeon: Earnestine Leys, MD;  Location: [facility] ORS;  Service: Orthopedics;  Laterality: Right;  . MELANOMA EXCISION    . TONSILLECTOMY  2005    Current Outpatient Medications on File Prior to Visit  Medication Sig Dispense Refill  . Vitamin D, Ergocalciferol, (DRISDOL) 1.25 MG (50000 UNIT) CAPS capsule Take 1 capsule (50,000 Units total) by mouth every 7 (seven) days. 14 capsule 1   No current facility-administered medications on file prior to visit.    No Known Allergies  Social History   Socioeconomic History  . Marital status: Single    Spouse name: Not on file  . Number of children: Not on file  . Years of education: Not on file  . Highest education level: Not on file  Occupational History  . Not on file  Tobacco Use  . Smoking  status: Never Smoker  . Smokeless tobacco: Never Used  Vaping Use  . Vaping Use: Never used  Substance and Sexual Activity  . Alcohol use: No  . Drug use: No  . Sexual activity: Yes    Birth control/protection: Pill  Other Topics Concern  . Not on file  Social History Narrative  . Not on file   Social Determinants of Health   Financial Resource Strain: Not on file  Food Insecurity: Not on file  Transportation Needs: Not on file  Physical Activity: Not on file  Stress: Not on file  Social Connections: Not on file  Intimate Partner Violence: Not on file    Family History  Problem Relation Age of Onset  . Cancer Neg Hx   . Diabetes Neg Hx   . Heart disease Neg Hx   . Breast cancer Neg Hx   . Ovarian cancer Neg Hx   . Colon cancer Neg Hx     The following portions of the patient's history were reviewed and updated as appropriate: allergies, current medications, past family history, past medical history, past social history, past surgical history and problem list.  Review of Systems  ROS negative except as noted above. Information obtained  from patient.   Objective:   BP 125/86   Pulse 64   Resp 16   Ht 5\' 6"  (1.676 m)   Wt 188 lb 4.8 oz (85.4 kg)   LMP 08/14/2020   BMI 30.39 kg/m    CONSTITUTIONAL: Well-developed, well-nourished female in no acute distress.   PHYSICAL EXAM: Not indicated.   Recent Results (from the past 2160 hour(s))  POCT urine pregnancy     Status: Abnormal   Collection Time: 09/24/20  4:03 PM  Result Value Ref Range   Preg Test, Ur Positive (A) Negative    Assessment:   1. Positive urine pregnancy test  - POCT urine pregnancy - US OB LESS THAN 14 WEEKS WITH OB TRANSVAGINAL; Future  2. History of miscarriage  - US OB LESS THAN 14 WEEKS WITH OB TRANSVAGINAL; Future  3. Confirm fetal viability with history of miscarriage, ultrasound  - US OB LESS THAN 14 WEEKS WITH OB TRANSVAGINAL; Future     Plan:   First trimester  education, see AVS.   Schedule dating/viability ultrasound, see orders.   Reviewed red flag symptoms and when to call.   RTC x 3-4 weeks for intake.   RTC x 6-7 weeks for NOB PE or sooner if needed.    Dani Gobble, CNM [facility], [facility]

## 2020-09-28 ENCOUNTER — Ambulatory Visit
Admission: RE | Admit: 2020-09-28 | Discharge: 2020-09-28 | Disposition: A | Discharge status: Home / Self Care | Payer: Medicaid Other | Source: Ambulatory Visit | Attending: Certified Nurse Midwife | Admitting: Certified Nurse Midwife

## 2020-09-28 ENCOUNTER — Other Ambulatory Visit: Payer: Self-pay

## 2020-09-28 ENCOUNTER — Other Ambulatory Visit: Payer: Self-pay | Admitting: Certified Nurse Midwife

## 2020-09-28 DIAGNOSIS — Z8759 Personal history of other complications of pregnancy, childbirth and the puerperium: Secondary | ICD-10-CM | POA: Insufficient documentation

## 2020-09-28 DIAGNOSIS — O3680X0 Pregnancy with inconclusive fetal viability, not applicable or unspecified: Secondary | ICD-10-CM

## 2020-09-28 DIAGNOSIS — Z3201 Encounter for pregnancy test, result positive: Secondary | ICD-10-CM | POA: Diagnosis not present

## 2020-10-13 ENCOUNTER — Other Ambulatory Visit: Payer: Self-pay

## 2020-10-13 ENCOUNTER — Ambulatory Visit
Admission: RE | Admit: 2020-10-13 | Discharge: 2020-10-13 | Disposition: A | Discharge status: Home / Self Care | Payer: Medicaid Other | Source: Ambulatory Visit | Attending: Certified Nurse Midwife | Admitting: Certified Nurse Midwife

## 2020-10-13 DIAGNOSIS — Z8759 Personal history of other complications of pregnancy, childbirth and the puerperium: Secondary | ICD-10-CM | POA: Insufficient documentation

## 2020-10-13 DIAGNOSIS — O3680X0 Pregnancy with inconclusive fetal viability, not applicable or unspecified: Secondary | ICD-10-CM | POA: Diagnosis not present

## 2020-10-16 ENCOUNTER — Ambulatory Visit (INDEPENDENT_AMBULATORY_CARE_PROVIDER_SITE_OTHER): Payer: Medicaid Other

## 2020-10-16 ENCOUNTER — Other Ambulatory Visit: Payer: Self-pay

## 2020-10-16 VITALS — BP 124/81 | HR 78 | Ht 66.0 in | Wt 182.9 lb

## 2020-10-16 DIAGNOSIS — Z113 Encounter for screening for infections with a predominantly sexual mode of transmission: Secondary | ICD-10-CM

## 2020-10-16 DIAGNOSIS — Z0283 Encounter for blood-alcohol and blood-drug test: Secondary | ICD-10-CM | POA: Diagnosis not present

## 2020-10-16 DIAGNOSIS — Z3481 Encounter for supervision of other normal pregnancy, first trimester: Secondary | ICD-10-CM

## 2020-10-16 NOTE — Patient Instructions (Signed)
[email or web address]">  First Trimester of Pregnancy  The first trimester of pregnancy starts on the first day of your last menstrual period until the end of week 12. This is also called months 1 through 3 of pregnancy. Body changes during your first trimester Your body goes through many changes during pregnancy. The changes usually return to normal after your baby is born. Physical changes  You may gain or lose weight.  Your breasts may grow larger and hurt. The area around your nipples may get darker.  Dark spots or blotches may develop on your face.  You may have changes in your hair. Health changes  You may feel like you might vomit (nauseous), and you may vomit.  You may have heartburn.  You may have headaches.  You may have trouble pooping (constipation).  Your gums may bleed. Other changes  You may get tired easily.  You may pee (urinate) more often.  Your menstrual periods will stop.  You may not feel hungry.  You may want to eat certain kinds of food.  You may have changes in your emotions from day to day.  You may have more dreams. Follow these instructions at home: Medicines  Take over-the-counter and prescription medicines only as told by your doctor. Some medicines are not safe during pregnancy.  Take a prenatal vitamin that contains at least 600 micrograms (mcg) of folic acid. Eating and drinking  Eat healthy meals that include: ? Fresh fruits and vegetables. ? Whole grains. ? Good sources of protein, such as meat, eggs, or tofu. ? Low-fat dairy products.  Avoid raw meat and unpasteurized juice, milk, and cheese.  If you feel like you may vomit, or you vomit: ? Eat 4 or 5 small meals a day instead of 3 large meals. ? Try eating a few soda crackers. ? Drink liquids between meals instead of during meals.  You may need to take these actions to prevent or treat trouble pooping: ? Drink enough fluids to keep your pee  (urine) pale yellow. ? Eat foods that are high in fiber. These include beans, whole grains, and fresh fruits and vegetables. ? Limit foods that are high in fat and sugar. These include fried or sweet foods. Activity  Exercise only as told by your doctor. Most people can do their usual exercise routine during pregnancy.  Stop exercising if you have cramps or pain in your lower belly (abdomen) or low back.  Do not exercise if it is too hot or too humid, or if you are in a place of great height (high altitude).  Avoid heavy lifting.  If you choose to, you may have sex unless your doctor tells you not to. Relieving pain and discomfort  Wear a good support bra if your breasts are sore.  Rest with your legs raised (elevated) if you have leg cramps or low back pain.  If you have bulging veins (varicose veins) in your legs: ? Wear support hose as told by your doctor. ? Raise your feet for 15 minutes, 3-4 times a day. ? Limit salt in your food. Safety  Wear your seat belt at all times when you are in a car.  Talk with your doctor if someone is hurting you or yelling at you.  Talk with your doctor if you are feeling sad or have thoughts of hurting yourself. Lifestyle  Do not use hot tubs, steam rooms, or saunas.  Do not douche. Do not use tampons or scented sanitary pads.  Do not  use herbal medicines, illegal drugs, or medicines that are not approved by your doctor. Do not drink alcohol.  Do not smoke or use any products that contain nicotine or tobacco. If you need help quitting, ask your doctor.  Avoid cat litter boxes and soil that is used by cats. These carry germs that can cause harm to the baby and can cause a loss of your baby by miscarriage or stillbirth. General instructions  Keep all follow-up visits. This is important.  Ask for help if you need counseling or if you need help with nutrition. Your doctor can give you advice or tell you where to go for help.  Visit your  dentist. At home, brush your teeth with a soft toothbrush. Floss gently.  Write down your questions. Take them to your prenatal visits. Where to find more information  American Pregnancy Association: americanpregnancy.org  [employer]: www.acog.org  Office on Women's Health: KeywordPortfolios.com.br Contact a doctor if:  You are dizzy.  You have a fever.  You have mild cramps or pressure in your lower belly.  You have a nagging pain in your belly area.  You continue to feel like you may vomit, you vomit, or you have watery poop (diarrhea) for 24 hours or longer.  You have a bad-smelling fluid coming from your vagina.  You have pain when you pee.  You are exposed to a disease that spreads from person to person, such as chickenpox, measles, Zika virus, HIV, or hepatitis. Get help right away if:  You have spotting or bleeding from your vagina.  You have very bad belly cramping or pain.  You have shortness of breath or chest pain.  You have any kind of injury, such as from a fall or a car crash.  You have new or increased pain, swelling, or redness in an arm or leg. Summary  The first trimester of pregnancy starts on the first day of your last menstrual period until the end of week 12 (months 1 through 3).  Eat 4 or 5 small meals a day instead of 3 large meals.  Do not smoke or use any products that contain nicotine or tobacco. If you need help quitting, ask your doctor.  Keep all follow-up visits. This information is not intended to replace advice given to you by your health care provider. Make sure you discuss any questions you have with your health care provider. Document Revised: 10/09/2019 Document Reviewed: 08/15/2019 Elsevier Patient Education  [place]. WHAT OB PATIENTS CAN EXPECT   Confirmation of pregnancy and ultrasound ordered if medically indicated-[redacted] weeks gestation  New OB (NOB) intake with nurse and  New OB (NOB) labs- [redacted] weeks gestation  New OB (NOB) physical examination with provider- 11/[redacted] weeks gestation  Flu vaccine-[redacted] weeks gestation  Anatomy scan-[redacted] weeks gestation  Glucose tolerance test, blood work to test for anemia, T-dap vaccine-[redacted] weeks gestation  Vaginal swabs/cultures-STD/Group B strep-[redacted] weeks gestation  Appointments every 4 weeks until 28 weeks  Every 2 weeks from 28 weeks until 36 weeks  Weekly visits from 36 weeks until delivery  Morning Sickness  Morning sickness is when you feel like you may vomit (feel nauseous) during pregnancy. Sometimes, you may vomit. Morning sickness most often happens in the morning, but it can also happen at any time of the day. Some women may have morning sickness that makes them vomit all the time. This is a more serious problem that needs treatment. What are the causes? The  cause of this condition is not known. What increases the risk?  You had vomiting or a feeling like you may vomit before your pregnancy.  You had morning sickness in another pregnancy.  You are pregnant with more than one baby, such as twins. What are the signs or symptoms?  Feeling like you may vomit.  Vomiting. How is this treated? Treatment is usually not needed for this condition. You may only need to change what you eat. In some cases, your doctor may give you some things to take for your condition. These include:  Vitamin B6 supplements.  Medicines to treat the feeling that you may vomit.  Ginger. Follow these instructions at home: Medicines  Take over-the-counter and prescription medicines only as told by your doctor. Do not take any medicines until you talk with your doctor about them first.  Take multivitamins before you get pregnant. These can stop or lessen the symptoms of morning sickness. Eating and drinking  Eat dry toast or crackers before getting out of bed.  Eat 5 or 6 small meals a day.  Eat dry and bland foods like rice and  baked potatoes.  Do not eat greasy, fatty, or spicy foods.  Have someone cook for you if the smell of food causes you to vomit or to feel like you may vomit.  If you feel like you may vomit after taking prenatal vitamins, take them at night or with a snack.  Eat protein foods when you need a snack. Nuts, yogurt, and cheese are good choices.  Drink fluids throughout the day.  Try ginger ale made with real ginger, ginger tea made from fresh grated ginger, or ginger candies. General instructions  Do not smoke or use any products that contain nicotine or tobacco. If you need help quitting, ask your doctor.  Use an air purifier to keep the air in your house free of smells.  Get lots of fresh air.  Try to avoid smells that make you feel sick.  Try wearing an acupressure wristband. This is a wristband that is used to treat seasickness.  Try a treatment called acupuncture. In this treatment, a doctor puts needles into certain areas of your body to make you feel better. Contact a doctor if:  You need medicine to feel better.  You feel dizzy or light-headed.  You are losing weight. Get help right away if:  The feeling that you may vomit will not go away, or you cannot stop vomiting.  You faint.  You have very bad pain in your belly. Summary  Morning sickness is when you feel like you may vomit (feel nauseous) during pregnancy.  You may feel sick in the morning, but you can feel this way at any time of the day.  Making some changes to what you eat may help your symptoms go away. This information is not intended to replace advice given to you by your health care provider. Make sure you discuss any questions you have with your health care provider. Document Revised: 12/16/2019 Document Reviewed: 11/25/2019 Elsevier Patient Education  2021 [employer]. How a Baby Grows During Pregnancy Pregnancy begins when a female's sperm enters a female's egg. This is called fertilization.  Fertilization usually happens in one of the fallopian tubes that connect the ovaries to the uterus. The fertilized egg moves down the fallopian tube to the uterus. Once it reaches the uterus, it implants into the lining of the uterus and begins to grow. For the first 8 weeks, the  fertilized egg is called an embryo. After 8 weeks, it is called a fetus. As the fetus continues to grow, it receives oxygen and nutrients through the placenta, which is an organ that grows to support the developing baby. The placenta is the life support system for the baby. It provides oxygen and nutrition and removes waste. How long does a typical pregnancy last? A pregnancy usually lasts 280 days, or about 40 weeks. Pregnancy is divided into three periods of growth, also called trimesters:  First trimester: 0-12 weeks.  Second trimester: 13-27 weeks.  Third trimester: 28-40 weeks. The day when your baby is ready to be born (full term) is your estimated date of delivery. However, most babies are not born on their estimated date of delivery. How does my baby develop month by month? First month  The fertilized egg attaches to the inside of the uterus.  Some cells will form the placenta. Others will form the fetus.  The arms, legs, brain, spinal cord, lungs, and heart begin to develop.  At the end of the first month, the heart begins to beat. Second month  The bones, inner ear, eyelids, hands, and feet form.  The genitals develop.  By the end of 8 weeks, all major organs are developing. Third month  All of the internal organs are forming.  Teeth develop below the gums.  Bones and muscles begin to grow. The spine can flex.  The skin is transparent.  Fingernails and toenails begin to form.  Arms and legs continue to grow longer, and hands and feet develop.  The fetus is about 3 inches (7.6 cm) long. Fourth month  The placenta is completely formed.  The external sex organs, neck, outer ear,  eyebrows, eyelids, and fingernails are formed.  The fetus can hear, swallow, and move its arms and legs.  The kidneys begin to produce urine.  The skin is covered with a white, waxy coating (vernix) and very fine hair (lanugo). Fifth month  The fetus moves around more and can be felt for the first time (quickening).  The fetus starts to sleep and wake up and may begin to suck a finger.  The nails grow to the end of the fingers.  The organ in the digestive system that makes bile (gallbladder) functions and helps to digest nutrients.  If the fetus is a female, eggs are present in the ovaries. If the fetus is a female, testicles start to move down into the scrotum. Sixth month  The lungs are formed.  The eyes open. The brain continues to develop.  Your baby has fingerprints and toe prints. Your baby's hair grows thicker.  At the end of the second trimester, the fetus is about 9 inches (22.9 cm) long. Seventh month  The fetus kicks and stretches.  The eyes are developed enough to sense changes in light.  The hands can make a grasping motion.  The fetus responds to sound. Eighth month  Most organs and body systems are fully developed and functioning.  Bones harden, and taste buds develop. The fetus may hiccup.  Certain areas of the brain are still developing. The skull remains soft. Ninth month  The fetus gains about  lb (0.23 kg) each week.  The lungs are fully developed.  Patterns of sleep develop.  The fetus's head typically moves into a head-down position (vertex) in the uterus to prepare for birth.  The fetus weighs 6-9 lb (2.72-4.08 kg) and is 19-20 inches (48.26-50.8 cm) long.   How  do I know if my baby is developing well? Always talk with your health care provider about any concerns that you may have about your pregnancy and your baby. At each prenatal visit, your health care provider will do several different tests to check on your health and keep track of  your baby's development. These include:  Fundal height and position. To do this, your health care provider will: ? Measure your growing belly from your pubic bone to the top of the uterus using a tape measure. ? Feel your belly to determine your baby's position.  Heartbeat. An ultrasound in the first trimester can confirm pregnancy and show a heartbeat, depending on how far along you are. Your health care provider will check your baby's heart rate at every prenatal visit. You will also have a second trimester ultrasound to check your baby's development. Follow these instructions at home:  Take prenatal vitamins as told by your health care provider. These include vitamins such as folic acid, iron, calcium, and vitamin D. They are important for healthy development.  Take over-the-counter and prescription medicines only as told by your health care provider.  Keep all follow-up visits. This is important. Follow-up visits include prenatal care and screening tests. Summary  A pregnancy usually lasts 280 days, or about 40 weeks. Pregnancy is divided into three periods of growth, also called trimesters.  Your health care provider will monitor your baby's growth and development throughout your pregnancy.  Follow your health care provider's recommendations about taking prenatal vitamins and medicines during your pregnancy.  Talk with your health care provider if you have any concerns about your pregnancy or your developing baby. This information is not intended to replace advice given to you by your health care provider. Make sure you discuss any questions you have with your health care provider. Document Revised: 10/09/2019 Document Reviewed: 08/15/2019 Elsevier Patient Education  2021 [place]. Commonly Asked Questions During Pregnancy  Cats: A parasite can be excreted in cat feces.  To avoid exposure you need to have another person empty the little box.  If you must empty the litter box  you will need to wear gloves.  Wash your hands after handling your cat.  This parasite can also be found in raw or undercooked meat so this should also be avoided.  Colds, Sore Throats, Flu: Please check your medication sheet to see what you can take for symptoms.  If your symptoms are unrelieved by these medications please call the office.  Dental Work: Most any dental work Investment banker, corporate recommends is permitted.  X-rays should only be taken during the first trimester if absolutely necessary.  Your abdomen should be shielded with a lead apron during all x-rays.  Please notify your provider prior to receiving any x-rays.  Novocaine is fine; gas is not recommended.  If your dentist requires a note from [place] prior to dental work please call the office and we will provide one for you.  Exercise: Exercise is an important part of staying healthy during your pregnancy.  You may continue most exercises you were accustomed to prior to pregnancy.  Later in your pregnancy you will most likely notice you have difficulty with activities requiring balance like riding a bicycle.  It is important that you listen to your body and avoid activities that put you at a higher risk of falling.  Adequate rest and staying well hydrated are a must!  If you have questions about the safety of specific activities ask your provider.  Exposure to Children with illness: Try to avoid obvious exposure; report any symptoms to [place] when noted,  If you have chicken pos, red measles or mumps, you should be immune to these diseases.   Please do not take any vaccines while pregnant unless you have checked with your OB provider.  Fetal Movement: After 28 weeks we recommend you do "kick counts" twice daily.  Lie or sit down in a calm quiet environment and count your baby movements "kicks".  You should feel your baby at least 10 times per hour.  If you have not felt 10 kicks within the first hour get up, walk around and have something sweet to eat or  drink then repeat for an additional hour.  If count remains less than 10 per hour notify your provider.  Fumigating: Follow your pest control agent's advice as to how long to stay out of your home.  Ventilate the area well before re-entering.  Hemorrhoids:   Most over-the-counter preparations can be used during pregnancy.  Check your medication to see what is safe to use.  It is important to use a stool softener or fiber in your diet and to drink lots of liquids.  If hemorrhoids seem to be getting worse please call the office.   Hot Tubs:  Hot tubs Jacuzzis and saunas are not recommended while pregnant.  These increase your internal body temperature and should be avoided.  Intercourse:  Sexual intercourse is safe during pregnancy as long as you are comfortable, unless otherwise advised by your provider.  Spotting may occur after intercourse; report any bright red bleeding that is heavier than spotting.  Labor:  If you know that you are in labor, please go to the hospital.  If you are unsure, please call the office and let us help you decide what to do.  Lifting, straining, etc:  If your job requires heavy lifting or straining please check with your provider for any limitations.  Generally, you should not lift items heavier than that you can lift simply with your hands and arms (no back muscles)  Painting:  Paint fumes do not harm your pregnancy, but may make you ill and should be avoided if possible.  Latex or water based paints have less odor than oils.  Use adequate ventilation while painting.  Permanents & Hair Color:  Chemicals in hair dyes are not recommended as they cause increase hair dryness which can increase hair loss during pregnancy.  " Highlighting" and permanents are allowed.  Dye may be absorbed differently and permanents may not hold as well during pregnancy.  Sunbathing:  Use a sunscreen, as skin burns easily during pregnancy.  Drink plenty of fluids; avoid over heating.  Tanning  Beds:  Because their possible side effects are still unknown, tanning beds are not recommended.  Ultrasound Scans:  Routine ultrasounds are performed at approximately 20 weeks.  You will be able to see your baby's general anatomy an if you would like to know the gender this can usually be determined as well.  If it is questionable when you conceived you may also receive an ultrasound early in your pregnancy for dating purposes.  Otherwise ultrasound exams are not routinely performed unless there is a medical necessity.  Although you can request a scan we ask that you pay for it when conducted because insurance does not cover " patient request" scans.  Work: If your pregnancy proceeds without complications you may work until your due date, unless your physician or employer  advises otherwise.  Round Ligament Pain/Pelvic Discomfort:  Sharp, shooting pains not associated with bleeding are fairly common, usually occurring in the second trimester of pregnancy.  They tend to be worse when standing up or when you remain standing for long periods of time.  These are the result of pressure of certain pelvic ligaments called "round ligaments".  Rest, Tylenol and heat seem to be the most effective relief.  As the womb and fetus grow, they rise out of the pelvis and the discomfort improves.  Please notify the office if your pain seems different than that described.  It may represent a more serious condition.  Common Medications Safe in Pregnancy  Acne:      Constipation:  Benzoyl Peroxide     Colace  Clindamycin      Dulcolax Suppository  Topica Erythromycin     Fibercon  Salicylic Acid      Metamucil         Miralax AVOID:        Senakot   Accutane    Cough:  Retin-A       Cough Drops  Tetracycline      Phenergan w/ Codeine if Rx  Minocycline      Robitussin (Plain &  DM)  Antibiotics:     Crabs/Lice:  Ceclor       RID  Cephalosporins    AVOID:  E-Mycins      Kwell  Keflex  Macrobid/Macrodantin   Diarrhea:  Penicillin      Kao-Pectate  Zithromax      Imodium AD         PUSH FLUIDS AVOID:       Cipro     Fever:  Tetracycline      Tylenol (Regular or Extra  Minocycline       Strength)  Levaquin      Extra Strength-Do not          Exceed 8 tabs/24 hrs Caffeine:        <231m/day (equiv. To 1 cup of coffee or  approx. 3 12 oz sodas)         Gas: Cold/Hayfever:       Gas-X  Benadryl      Mylicon  Claritin       Phazyme  **Claritin-D        Chlor-Trimeton    Headaches:  Dimetapp      ASA-Free Excedrin  Drixoral-Non-Drowsy     Cold Compress  Mucinex (Guaifenasin)     Tylenol (Regular or Extra  Sudafed/Sudafed-12 Hour     Strength)  **Sudafed PE Pseudoephedrine   Tylenol Cold & Sinus     Vicks Vapor Rub  Zyrtec  **AVOID if Problems With Blood Pressure         Heartburn: Avoid lying down for at least 1 hour after meals  Aciphex      Maalox     Rash:  Milk of Magnesia     Benadryl    Mylanta       1% Hydrocortisone Cream  Pepcid  Pepcid Complete   Sleep Aids:  Prevacid      Ambien   Prilosec       Benadryl  Rolaids       Chamomile Tea  Tums (Limit 4/day)     Unisom         Tylenol PM         Warm milk-add vanilla or  Hemorrhoids:       Sugar for  taste  Anusol/Anusol H.C.  (RX: Analapram 2.5%)  Sugar Substitutes:  Hydrocortisone OTC     Ok in moderation  Preparation H      Tucks        Vaseline lotion applied to tissue with wiping    Herpes:     Throat:  Acyclovir      Oragel  Famvir  Valtrex     Vaccines:         Flu Shot Leg Cramps:       *Gardasil  Benadryl      Hepatitis A         Hepatitis B Nasal Spray:       Pneumovax  Saline Nasal Spray     Polio Booster         Tetanus Nausea:       Tuberculosis test or PPD  Vitamin B6 25 mg TID   AVOID:    Dramamine      *Gardasil  Emetrol       Live Poliovirus  Ginger  Root 250 mg QID    MMR (measles, mumps &  High Complex Carbs @ Bedtime    rebella)  Sea Bands-Accupressure    Varicella (Chickenpox)  Unisom 1/2 tab TID     *No known complications           If received before Pain:         Known pregnancy;   Darvocet       Resume series after  Lortab        Delivery  Percocet    Yeast:   Tramadol      Femstat  Tylenol 3      Gyne-lotrimin  Ultram       Monistat  Vicodin           MISC:         All Sunscreens           Hair Coloring/highlights          Insect Repellant's          (Including DEET)         Mystic Tans

## 2020-10-16 NOTE — Progress Notes (Signed)
      Dawn Morris presents for NOB nurse intake visit. Pregnancy confirmation done at [facility], 09/24/2020, with Diona Fanti, CNM.  G 3.  P 1011.  LMP 08/14/2020.  EDD 05/31/2021.  Ga [redacted]w[redacted]d. Pregnancy education material explained and given.  1 cats in the home.  NOB labs ordered. BMI less than 30. TSH/HbgA1c not ordered. Sickle cell not ordered due to race. HIV and drug screen explained and ordered. Genetic screening discussed. Genetic testing; will get testing during NOB PE with provider. Pt to discuss genetic testing with provider. PNV encouraged. Pt to follow up with provider in 1 weeks for NOB physical. FMLA, [place] and HIV/Drug screening forms all signed and reviewed with pt.

## 2020-10-17 LAB — MICROSCOPIC EXAMINATION
Casts: NONE SEEN /lpf
Epithelial Cells (non renal): >10 /hpf — AB (ref 0–10)
RBC, Urine: NONE SEEN /hpf (ref 0–2)

## 2020-10-17 LAB — URINALYSIS, ROUTINE W REFLEX MICROSCOPIC
Bilirubin, UA: NEGATIVE
Glucose, UA: NEGATIVE
Ketones, UA: NEGATIVE
Nitrite, UA: NEGATIVE
Protein,UA: NEGATIVE
RBC, UA: NEGATIVE
Specific Gravity, UA: 1.009 (ref 1.005–1.030)
Urobilinogen, Ur: 0.2 mg/dL (ref 0.2–1.0)
pH, UA: 6.5 (ref 5.0–7.5)

## 2020-10-17 LAB — VIRAL HEPATITIS HBV, HCV
HCV Ab: 0.1 s/co ratio (ref 0.0–0.9)
Hep B Core Total Ab: NEGATIVE
Hep B Surface Ab, Qual: REACTIVE
Hepatitis B Surface Ag: NEGATIVE

## 2020-10-17 LAB — ABO AND RH: Rh Factor: POSITIVE

## 2020-10-17 LAB — HCV INTERPRETATION

## 2020-10-17 LAB — ANTIBODY SCREEN: Antibody Screen: NEGATIVE

## 2020-10-17 LAB — RUBELLA SCREEN: Rubella Antibodies, IGG: 5.76 index (ref >0.99)

## 2020-10-17 LAB — TOXOPLASMA ANTIBODIES- IGG AND  IGM
Toxoplasma Antibody- IgM: 3.7 AU/mL (ref 0.0–7.9)
Toxoplasma IgG Ratio: 44.5 IU/mL — ABNORMAL HIGH (ref 0.0–7.1)

## 2020-10-17 LAB — HIV ANTIBODY (ROUTINE TESTING W REFLEX): HIV Screen 4th Generation wRfx: NONREACTIVE

## 2020-10-17 LAB — VARICELLA ZOSTER ANTIBODY, IGG: Varicella zoster IgG: 603 index (ref >165)

## 2020-10-17 LAB — RPR: RPR Ser Ql: NONREACTIVE

## 2020-10-18 LAB — DRUG PROFILE, UR, 9 DRUGS (LABCORP)
Amphetamines, Urine: NEGATIVE ng/mL
Barbiturate Quant, Ur: NEGATIVE ng/mL
Benzodiazepine Quant, Ur: NEGATIVE ng/mL
Cannabinoid Quant, Ur: NEGATIVE ng/mL
Cocaine (Metab.): NEGATIVE ng/mL
Methadone Screen, Urine: NEGATIVE ng/mL
Opiate Quant, Ur: NEGATIVE ng/mL
PCP Quant, Ur: NEGATIVE ng/mL
Propoxyphene: NEGATIVE ng/mL

## 2020-10-18 LAB — NICOTINE SCREEN, URINE: Cotinine Ql Scrn, Ur: NEGATIVE ng/mL

## 2020-10-19 LAB — GC/CHLAMYDIA PROBE AMP
Chlamydia trachomatis, NAA: NEGATIVE
Neisseria Gonorrhoeae by PCR: NEGATIVE

## 2020-10-21 LAB — CULTURE, OB URINE

## 2020-10-21 LAB — URINE CULTURE, OB REFLEX

## 2020-10-21 NOTE — Progress Notes (Signed)
History of toxoplasmosis? Dawn Morris

## 2020-10-23 ENCOUNTER — Ambulatory Visit (INDEPENDENT_AMBULATORY_CARE_PROVIDER_SITE_OTHER): Payer: Medicaid Other | Admitting: Certified Nurse Midwife

## 2020-10-23 ENCOUNTER — Other Ambulatory Visit: Payer: Self-pay

## 2020-10-23 ENCOUNTER — Encounter: Payer: Medicaid Other | Admitting: Certified Nurse Midwife

## 2020-10-23 ENCOUNTER — Encounter: Payer: Self-pay | Admitting: Certified Nurse Midwife

## 2020-10-23 VITALS — BP 107/72 | HR 73 | Wt 182.0 lb

## 2020-10-23 DIAGNOSIS — Z3481 Encounter for supervision of other normal pregnancy, first trimester: Secondary | ICD-10-CM | POA: Diagnosis not present

## 2020-10-23 DIAGNOSIS — Z124 Encounter for screening for malignant neoplasm of cervix: Secondary | ICD-10-CM

## 2020-10-23 DIAGNOSIS — Z3A08 8 weeks gestation of pregnancy: Secondary | ICD-10-CM | POA: Diagnosis not present

## 2020-10-23 NOTE — Progress Notes (Signed)
NOB Physical: She is doing well today, no concerns.

## 2020-10-23 NOTE — Progress Notes (Signed)
NEW OB HISTORY AND PHYSICAL  SUBJECTIVE:       Dawn Morris is a 30 y.o. G32P1011 female, Patient's last menstrual period was 08/14/2020., Estimated Date of Delivery: 05/31/21, [redacted]w[redacted]d, presents today for establishment of Prenatal Care.  She has no unusual complaints and complains of fatigue.   Denies difficulty breathing or respiratory distress, chest pain, abdominal pain, vaginal bleeding, dysuria, and leg pain or swelling.   Desires genetic screening.    Gynecologic History  Patient's last menstrual period was 08/14/2020.   Contraception: none  Last Pap: 08/2017. Results were: normal  Obstetric History  OB History  Gravida Para Term Preterm AB Living  3 1 1   1 1   SAB IAB Ectopic Multiple Live Births  1     0 1    # Outcome Date GA Lbr Len/2nd Weight Sex Delivery Anes PTL Lv  3 Current           2 SAB 2018          1 Term 01/30/15 [redacted]w[redacted]d 11:52 / 01:14 8 lb 0.4 oz (3.64 kg) F Vag-Spont EPI  LIV     Birth Comments: none noted    Past Medical History:  Diagnosis Date   Cancer ([place])    Increased BMI     Past Surgical History:  Procedure Laterality Date   ACHILLES TENDON SURGERY Right 12/06/2017   Procedure: ACHILLES TENDON REPAIR;  Surgeon: Earnestine Leys, MD;  Location: [facility] ORS;  Service: Orthopedics;  Laterality: Right;   MELANOMA EXCISION     TONSILLECTOMY  2005    Current Outpatient Medications on File Prior to Visit  Medication Sig Dispense Refill   docusate sodium (COLACE) 100 MG capsule Take 100 mg by mouth 2 (two) times daily.     Prenatal Vit-Fe Fumarate-FA (PRENATAL MULTIVITAMIN) TABS tablet Take 1 tablet by mouth daily at 12 noon.     No current facility-administered medications on file prior to visit.    No Known Allergies  Social History   Socioeconomic History   Marital status: Single    Spouse name: Not on file   Number of children: Not on file   Years of education: Not on file   Highest education level: Not on file  Occupational  History   Not on file  Tobacco Use   Smoking status: Never   Smokeless tobacco: Never  Vaping Use   Vaping Use: Never used  Substance and Sexual Activity   Alcohol use: No   Drug use: No   Sexual activity: Yes  Other Topics Concern   Not on file  Social History Narrative   Not on file   Social Determinants of Health   Financial Resource Strain: Not on file  Food Insecurity: Not on file  Transportation Needs: Not on file  Physical Activity: Not on file  Stress: Not on file  Social Connections: Not on file  Intimate Partner Violence: Not on file    Family History  Problem Relation Age of Onset   Healthy Mother    Healthy Father    Healthy Sister    Healthy Maternal Aunt    Healthy Maternal Uncle    Healthy Paternal Aunt    Healthy Paternal Uncle    Healthy Maternal Grandmother    Cancer Neg Hx    Diabetes Neg Hx    Heart disease Neg Hx    Breast cancer Neg Hx    Ovarian cancer Neg Hx    Colon cancer Neg Hx  The following portions of the patient's history were reviewed and updated as appropriate: allergies, current medications, past OB history, past medical history, past surgical history, past family history, past social history, and problem list.  Review of Systems:  ROS negative except as noted above. Information obtained from patient.   OBJECTIVE:  BP 107/72   Pulse 73   Wt 182 lb (82.6 kg)   LMP 08/14/2020   BMI 29.38 kg/m   Initial Physical Exam (New OB)  GENERAL APPEARANCE: alert, well appearing, in no apparent distress  HEAD: normocephalic, atraumatic  MOUTH: deferred due to COVID-19 pandemic  THYROID: no thyromegaly or masses present  BREASTS: no masses noted, no significant tenderness, no palpable axillary nodes, no skin changes  LUNGS: clear to auscultation, no wheezes, rales or rhonchi, symmetric air entry  HEART: regular rate and rhythm, no murmurs  ABDOMEN: soft, nontender, nondistended, no abnormal masses, no epigastric pain  and FHTs present on bedside ultrasound  EXTREMITIES: no redness or tenderness in the calves or thighs, no edema  SKIN: normal coloration and turgor, no rashes; professional tattoos present  LYMPH NODES: no adenopathy palpable  NEUROLOGIC: alert, oriented, normal speech, no focal findings or movement disorder noted  PELVIC EXAM EXTERNAL GENITALIA: normal appearing vulva with no masses, tenderness or lesions VAGINA: no abnormal discharge or lesions CERVIX: no lesions or cervical motion tenderness and Pap collected UTERUS: gravid and consistent with 8 weeks ADNEXA: no masses palpable and nontender  ASSESSMENT:  1. Encounter for supervision of other normal pregnancy, first trimester   2. [redacted] weeks gestation of pregnancy   3. Screening for cervical cancer    PLAN: Prenatal care New OB counseling: The patient has been given an overview regarding routine prenatal care. Recommendations regarding diet, weight gain, and exercise in pregnancy were given. Prenatal testing, optional genetic testing, and ultrasound use in pregnancy were reviewed.  Benefits of Breast Feeding were discussed. The patient is encouraged to consider nursing her baby post partum.  See orders

## 2020-10-23 NOTE — Patient Instructions (Signed)
[email or web address]">  First Trimester of Pregnancy  The first trimester of pregnancy starts on the first day of your last menstrual period until the end of week 12. This is also called months 1 through 3 ofpregnancy. Body changes during your first trimester Your body goes through many changes during pregnancy. The changes usuallyreturn to normal after your baby is born. Physical changes You may gain or lose weight. Your breasts may grow larger and hurt. The area around your nipples may get darker. Dark spots or blotches may develop on your face. You may have changes in your hair. Health changes You may feel like you might vomit (nauseous), and you may vomit. You may have heartburn. You may have headaches. You may have trouble pooping (constipation). Your gums may bleed. Other changes You may get tired easily. You may pee (urinate) more often. Your menstrual periods will stop. You may not feel hungry. You may want to eat certain kinds of food. You may have changes in your emotions from day to day. You may have more dreams. Follow these instructions at home: Medicines Take over-the-counter and prescription medicines only as told by your doctor. Some medicines are not safe during pregnancy. Take a prenatal vitamin that contains at least 600 micrograms (mcg) of folic acid. Eating and drinking Eat healthy meals that include: Fresh fruits and vegetables. Whole grains. Good sources of protein, such as meat, eggs, or tofu. Low-fat dairy products. Avoid raw meat and unpasteurized juice, milk, and cheese. If you feel like you may vomit, or you vomit: Eat 4 or 5 small meals a day instead of 3 large meals. Try eating a few soda crackers. Drink liquids between meals instead of during meals. You may need to take these actions to prevent or treat trouble pooping: Drink enough fluids to keep your pee (urine) pale yellow. Eat foods that are high in fiber. These  include beans, whole grains, and fresh fruits and vegetables. Limit foods that are high in fat and sugar. These include fried or sweet foods. Activity Exercise only as told by your doctor. Most people can do their usual exercise routine during pregnancy. Stop exercising if you have cramps or pain in your lower belly (abdomen) or low back. Do not exercise if it is too hot or too humid, or if you are in a place of great height (high altitude). Avoid heavy lifting. If you choose to, you may have sex unless your doctor tells you not to. Relieving pain and discomfort Wear a good support bra if your breasts are sore. Rest with your legs raised (elevated) if you have leg cramps or low back pain. If you have bulging veins (varicose veins) in your legs: Wear support hose as told by your doctor. Raise your feet for 15 minutes, 3-4 times a day. Limit salt in your food. Safety Wear your seat belt at all times when you are in a car. Talk with your doctor if someone is hurting you or yelling at you. Talk with your doctor if you are feeling sad or have thoughts of hurting yourself. Lifestyle Do not use hot tubs, steam rooms, or saunas. Do not douche. Do not use tampons or scented sanitary pads. Do not use herbal medicines, illegal drugs, or medicines that are not approved by your doctor. Do not drink alcohol. Do not smoke or use any products that contain nicotine or tobacco. If you need help quitting, ask your doctor. Avoid cat litter boxes and soil that is used by cats. These carry  germs that can cause harm to the baby and can cause a loss of your baby by miscarriage or stillbirth. General instructions Keep all follow-up visits. This is important. Ask for help if you need counseling or if you need help with nutrition. Your doctor can give you advice or tell you where to go for help. Visit your dentist. At home, brush your teeth with a soft toothbrush. Floss gently. Write down your questions. Take them  to your prenatal visits. Where to find more information American Pregnancy Association: americanpregnancy.org [employer]st[employer]ians [employer]necologists: www.acog.org Office on Women's Health: KeywordPortfolios.com.br Contact a doctor if: You are dizzy. You have a fever. You have mild cramps or pressure in your lower belly. You have a nagging pain in your belly area. You continue to feel like you may vomit, you vomit, or you have watery poop (diarrhea) for 24 hours or longer. You have a bad-smelling fluid coming from your vagina. You have pain when you pee. You are exposed to a disease that spreads from person to person, such as chickenpox, measles, Zika virus, HIV, or hepatitis. Get help right away if: You have spotting or bleeding from your vagina. You have very bad belly cramping or pain. You have shortness of breath or chest pain. You have any kind of injury, such as from a fall or a car crash. You have new or increased pain, swelling, or redness in an arm or leg. Summary The first trimester of pregnancy starts on the first day of your last menstrual period until the end of week 12 (months 1 through 3). Eat 4 or 5 small meals a day instead of 3 large meals. Do not smoke or use any products that contain nicotine or tobacco. If you need help quitting, ask your doctor. Keep all follow-up visits. This information is not intended to replace advice given to you by your health care provider. Make sure you discuss any questions you have with your healthcare provider. Document Revised: 10/09/2019 Document Reviewed: 08/15/2019 Elsevier Patient Education  2022 [employer].

## 2020-10-27 LAB — IGP, APTIMA HPV: HPV Aptima: NEGATIVE

## 2020-11-06 ENCOUNTER — Other Ambulatory Visit: Payer: Self-pay

## 2020-11-06 ENCOUNTER — Other Ambulatory Visit: Payer: Medicaid Other

## 2020-11-06 DIAGNOSIS — Z1379 Encounter for other screening for genetic and chromosomal anomalies: Secondary | ICD-10-CM

## 2020-11-06 DIAGNOSIS — Z3481 Encounter for supervision of other normal pregnancy, first trimester: Secondary | ICD-10-CM

## 2020-11-09 ENCOUNTER — Encounter: Payer: Medicaid Other | Admitting: Certified Nurse Midwife

## 2020-11-12 LAB — MATERNIT21  PLUS CORE+ESS+SCA, BLOOD

## 2020-11-25 ENCOUNTER — Other Ambulatory Visit: Payer: Self-pay

## 2020-11-25 ENCOUNTER — Ambulatory Visit (INDEPENDENT_AMBULATORY_CARE_PROVIDER_SITE_OTHER): Payer: Medicaid Other | Admitting: Certified Nurse Midwife

## 2020-11-25 ENCOUNTER — Encounter: Payer: Self-pay | Admitting: Certified Nurse Midwife

## 2020-11-25 VITALS — BP 138/82 | HR 82 | Wt 173.6 lb

## 2020-11-25 DIAGNOSIS — Z3481 Encounter for supervision of other normal pregnancy, first trimester: Secondary | ICD-10-CM

## 2020-11-25 DIAGNOSIS — Z3A13 13 weeks gestation of pregnancy: Secondary | ICD-10-CM

## 2020-11-25 LAB — POCT URINALYSIS DIPSTICK OB
Bilirubin, UA: NEGATIVE
Blood, UA: NEGATIVE
Glucose, UA: NEGATIVE
Ketones, UA: NEGATIVE
Leukocytes, UA: NEGATIVE
Nitrite, UA: NEGATIVE
POC,PROTEIN,UA: NEGATIVE
Spec Grav, UA: 1.01 (ref 1.010–1.025)
Urobilinogen, UA: 0.2 E.U./dL
pH, UA: 7 (ref 5.0–8.0)

## 2020-11-25 NOTE — Patient Instructions (Signed)
https://www.acog.org/womens-health/faqs/prenatal-genetic-screening-tests">  Prenatal Care Prenatal care is health care during pregnancy. It helps you and your unborn baby (fetus) stay as healthy as possible. Prenatal care may be provided by a midwife, a family practice doctor, a IT consultant (nurse practitioner or physician assistant), or a childbirth and pregnancy doctor (obstetrician). How does this affect me? During pregnancy, you will be closely monitored for any new conditions that might develop. To lower your risk of pregnancy complications, you and yourhealth care provider will talk about any underlying conditions you have. How does this affect my baby? Early and consistent prenatal care increases the chance that your baby will be healthy during pregnancy. Prenatal care lowers the risk that your baby will be: Born early (prematurely). Smaller than expected at birth (small for gestational age). What can I expect at the first prenatal care visit? Your first prenatal care visit will likely be the longest. You should schedule your first prenatal care visit as soon as you know that you are pregnant. Your first visit is a good time to talk about any questions or concerns you haveabout pregnancy. Medical history At your visit, you and your health care provider will talk about your medical history, including: Any past pregnancies. Your family's medical history. Medical history of the baby's father. Any long-term (chronic) health conditions you have and how you manage them. Any surgeries or procedures you have had. Any current over-the-counter or prescription medicines, herbs, or supplements that you are taking. Other factors that could pose a risk to your baby, including: Exposure to harmful chemicals or radiation at work or at home. Any substance use, including tobacco, alcohol, and drug use. Your home setting and your stress levels, including: Exposure to abuse or  violence. Household financial strain. Your daily health habits, including diet and exercise. Tests and screenings Your health care provider will: Measure your weight, height, and blood pressure. Do a physical exam, including a pelvic and breast exam. Perform blood tests and urine tests to check for: Urinary tract infection. Sexually transmitted infections (STIs). Low iron levels in your blood (anemia). Blood type and certain proteins on red blood cells (Rh antibodies). Infections and immunity to viruses, such as hepatitis B and rubella. HIV (human immunodeficiency virus). Discuss your options for genetic screening. Tips about staying healthy Your health care provider will also give you information about how to keep yourself and your baby healthy, including: Nutrition and taking vitamins. Physical activity. How to manage pregnancy symptoms such as nausea and vomiting (morning sickness). Infections and substances that may be harmful to your baby and how to avoid them. Food safety. Dental care. Working. Travel. Warning signs to watch for and when to call your health care provider. How often will I have prenatal care visits? After your first prenatal care visit, you will have regular visits throughout your pregnancy. The visit schedule is often as follows: Up to week 28 of pregnancy: once every 4 weeks. 28-36 weeks: once every 2 weeks. After 36 weeks: every week until delivery. Some women may have visits more or less often depending on any underlyinghealth conditions and the health of the baby. Keep all follow-up and prenatal care visits. This is important. What happens during routine prenatal care visits? Your health care provider will: Measure your weight and blood pressure. Check for fetal heart sounds. Measure the height of your uterus in your abdomen (fundal height). This may be measured starting around week 20 of pregnancy. Check the position of your baby inside your  uterus. Ask questions  about your diet, sleeping patterns, and whether you can feel the baby move. Review warning signs to watch for and signs of labor. Ask about any pregnancy symptoms you are having and how you are dealing with them. Symptoms may include: Headaches. Nausea and vomiting. Vaginal discharge. Swelling. Fatigue. Constipation. Changes in your vision. Feeling persistently sad or anxious. Any discomfort, including back or pelvic pain. Bleeding or spotting. Make a list of questions to ask your health care provider at your routinevisits. What tests might I have during prenatal care visits? You may have blood, urine, and imaging tests throughout your pregnancy, such as: Urine tests to check for glucose, protein, or signs of infection. Glucose tests to check for a form of diabetes that can develop during pregnancy (gestational diabetes mellitus). This is usually done around week 24 of pregnancy. Ultrasounds to check your baby's growth and development, to check for birth defects, and to check your baby's well-being. These can also help to decide when you should deliver your baby. A test to check for group B strep (GBS) infection. This is usually done around week 36 of pregnancy. Genetic testing. This may include blood, fluid, or tissue sampling, or imaging tests, such as an ultrasound. Some genetic tests are done during the first trimester and some are done during the second trimester. What else can I expect during prenatal care visits? Your health care provider may recommend getting certain vaccines during pregnancy. These may include: A yearly flu shot (annual influenza vaccine). This is especially important if you will be pregnant during flu season. Tdap (tetanus, diphtheria, pertussis) vaccine. Getting this vaccine during pregnancy can protect your baby from whooping cough (pertussis) after birth. This vaccine may be recommended between weeks 27 and 36 of pregnancy. A COVID-19  vaccine. Later in your pregnancy, your health care provider may give you information about: Childbirth and breastfeeding classes. Choosing a health care provider for your baby. Umbilical cord banking. Breastfeeding. Birth control after your baby is born. The hospital labor and delivery unit and how to set up a tour. Registering at the hospital before you go into labor. Where to find more information Office on Women's Health: LegalWarrants.gl American Pregnancy Association: americanpregnancy.org March of Dimes: marchofdimes.org Summary Prenatal care helps you and your baby stay as healthy as possible during pregnancy. Your first prenatal care visit will most likely be the longest. You will have visits and tests throughout your pregnancy to monitor your health and your baby's health. Bring a list of questions to your visits to ask your health care provider. Make sure to keep all follow-up and prenatal care visits. This information is not intended to replace advice given to you by your health care provider. Make sure you discuss any questions you have with your healthcare provider. Document Revised: 02/13/2020 Document Reviewed: 02/13/2020 Elsevier Patient Education  [place].

## 2020-11-25 NOTE — Progress Notes (Signed)
ROB doing well. Discussed weight loss, reassurance given. She has had some aches and pain, she expressed not feeling them with her first. Discussed decreased in elasticity with each pregnancy. Reassurance given. She  Verbalizes understanding. U/s ordered for anatomy in 7 wks. Follow ROB 5 wk with Deneise Lever .   [name], CNM

## 2020-11-30 ENCOUNTER — Other Ambulatory Visit: Payer: Self-pay

## 2020-11-30 ENCOUNTER — Ambulatory Visit (INDEPENDENT_AMBULATORY_CARE_PROVIDER_SITE_OTHER): Payer: Medicaid Other | Admitting: Certified Nurse Midwife

## 2020-11-30 VITALS — BP 112/81 | HR 81 | Wt 169.7 lb

## 2020-11-30 DIAGNOSIS — Z3A30 30 weeks gestation of pregnancy: Secondary | ICD-10-CM

## 2020-11-30 DIAGNOSIS — Z3481 Encounter for supervision of other normal pregnancy, first trimester: Secondary | ICD-10-CM

## 2020-11-30 LAB — POCT URINALYSIS DIPSTICK OB
Bilirubin, UA: NEGATIVE
Blood, UA: NEGATIVE
Glucose, UA: NEGATIVE
Ketones, UA: NEGATIVE
Leukocytes, UA: NEGATIVE
Nitrite, UA: NEGATIVE
POC,PROTEIN,UA: NEGATIVE
Spec Grav, UA: 1.01 (ref 1.010–1.025)
Urobilinogen, UA: 0.2 E.U./dL
pH, UA: 7 (ref 5.0–8.0)

## 2020-11-30 NOTE — Progress Notes (Signed)
Pt presents for problem visit she fell on Saturday. Her dog ran between her feet causing her to fall to her lefts side. She did fall and hit the side of her belly. She has visibale brusing on her side and on her eye( state she hit her eye on side table). She denies cramping and vaginal bleeding. FHT today FHT 161. Reassurance given. Red flag symptoms reviewed. Follow up as scheduled or PRN.   [name], CNM

## 2020-12-08 ENCOUNTER — Other Ambulatory Visit: Payer: Self-pay

## 2020-12-08 DIAGNOSIS — Z3481 Encounter for supervision of other normal pregnancy, first trimester: Secondary | ICD-10-CM

## 2020-12-23 ENCOUNTER — Ambulatory Visit (INDEPENDENT_AMBULATORY_CARE_PROVIDER_SITE_OTHER): Payer: Medicaid Other | Admitting: Certified Nurse Midwife

## 2020-12-23 ENCOUNTER — Encounter: Payer: Self-pay | Admitting: Certified Nurse Midwife

## 2020-12-23 ENCOUNTER — Other Ambulatory Visit: Payer: Self-pay

## 2020-12-23 VITALS — BP 120/79 | HR 76 | Wt 177.8 lb

## 2020-12-23 DIAGNOSIS — Z3A17 17 weeks gestation of pregnancy: Secondary | ICD-10-CM

## 2020-12-23 DIAGNOSIS — Z3482 Encounter for supervision of other normal pregnancy, second trimester: Secondary | ICD-10-CM

## 2020-12-23 LAB — POCT URINALYSIS DIPSTICK OB
Bilirubin, UA: NEGATIVE
Blood, UA: NEGATIVE
Glucose, UA: NEGATIVE
Ketones, UA: NEGATIVE
Leukocytes, UA: NEGATIVE
Nitrite, UA: NEGATIVE
POC,PROTEIN,UA: NEGATIVE
Spec Grav, UA: 1.01 (ref 1.010–1.025)
Urobilinogen, UA: 0.2 E.U./dL
pH, UA: 7 (ref 5.0–8.0)

## 2020-12-23 NOTE — Patient Instructions (Signed)
Round Ligament Pain  The round ligament is a cord of muscle and tissue that helps support the uterus. It can become a source of pain during pregnancy if it becomes stretched or twisted as the baby grows. The pain usually begins in the second trimester (13-28 weeks) of pregnancy, and it can come and go until the baby is delivered.It is not a serious problem, and it does not cause harm to the baby. Round ligament pain is usually a short, sharp, and pinching pain, but it can also be a dull, lingering, and aching pain. The pain is felt in the lower side of the abdomen or in the groin. It usually starts deep in the groin and moves up to the outside of the hip area. The pain may occur when you: Suddenly change position, such as quickly going from a sitting to standing position. Roll over in bed. Cough or sneeze. Do physical activity. Follow these instructions at home:  Watch your condition for any changes. When the pain starts, relax. Then try any of these methods to help with the pain: Sitting down. Flexing your knees up to your abdomen. Lying on your side with one pillow under your abdomen and another pillow between your legs. Sitting in a warm bath for 15-20 minutes or until the pain goes away. Take over-the-counter and prescription medicines only as told by your health care provider. Move slowly when you sit down or stand up. Avoid long walks if they cause pain. Stop or reduce your physical activities if they cause pain. Keep all follow-up visits as told by your health care provider. This is important. Contact a health care provider if: Your pain does not go away with treatment. You feel pain in your back that you did not have before. Your medicine is not helping. Get help right away if: You have a fever or chills. You develop uterine contractions. You have vaginal bleeding. You have nausea or vomiting. You have diarrhea. You have pain when you urinate. Summary Round ligament pain is  felt in the lower abdomen or groin. It is usually a short, sharp, and pinching pain. It can also be a dull, lingering, and aching pain. This pain usually begins in the second trimester (13-28 weeks). It occurs because the uterus is stretching with the growing baby, and it is not harmful to the baby. You may notice the pain when you suddenly change position, when you cough or sneeze, or during physical activity. Relaxing, flexing your knees to your abdomen, lying on one side, or taking a warm bath may help to get rid of the pain. Get help from your health care provider if the pain does not go away or if you have vaginal bleeding, nausea, vomiting, diarrhea, or painful urination. This information is not intended to replace advice given to you by your health care provider. Make sure you discuss any questions you have with your healthcare provider. Document Revised: 04/17/2020 Document Reviewed: 10/18/2017 Elsevier Patient Education  [place].

## 2020-12-23 NOTE — Progress Notes (Signed)
ROB doing well.Not feeling movement yet. Discussed round ligament pain and self help measures. Has u/s scheduled 8/17. Will follow up with results. All questions answered . Follow up 4 wk for ROB.   [name], CNM

## 2020-12-30 ENCOUNTER — Other Ambulatory Visit: Payer: Self-pay

## 2020-12-30 ENCOUNTER — Ambulatory Visit
Admission: RE | Admit: 2020-12-30 | Discharge: 2020-12-30 | Disposition: A | Discharge status: Home / Self Care | Payer: Medicaid Other | Source: Ambulatory Visit | Attending: Certified Nurse Midwife | Admitting: Certified Nurse Midwife

## 2020-12-30 ENCOUNTER — Other Ambulatory Visit: Payer: Self-pay | Admitting: Certified Nurse Midwife

## 2020-12-30 DIAGNOSIS — Z3689 Encounter for other specified antenatal screening: Secondary | ICD-10-CM | POA: Insufficient documentation

## 2021-01-01 ENCOUNTER — Other Ambulatory Visit: Payer: Self-pay | Admitting: Certified Nurse Midwife

## 2021-01-01 MED ORDER — VALACYCLOVIR HCL 1 G PO TABS: 1000.0000 mg | ORAL_TABLET | Freq: Two times a day (BID) | ORAL | 2 refills | Status: AC

## 2021-01-08 ENCOUNTER — Other Ambulatory Visit: Payer: Self-pay | Admitting: Certified Nurse Midwife

## 2021-01-08 MED ORDER — FAMOTIDINE 20 MG PO TABS: 20.0000 mg | ORAL_TABLET | Freq: Two times a day (BID) | ORAL | 3 refills | Status: DC

## 2021-01-20 ENCOUNTER — Encounter: Payer: Medicaid Other | Admitting: Certified Nurse Midwife

## 2021-01-21 ENCOUNTER — Telehealth: Payer: Self-pay

## 2021-01-21 ENCOUNTER — Encounter: Payer: Self-pay | Admitting: Obstetrics and Gynecology

## 2021-01-21 ENCOUNTER — Observation Stay
Admission: EM | Admit: 2021-01-21 | Discharge: 2021-01-21 | Disposition: A | Discharge status: Home / Self Care | Payer: Medicaid Other | Attending: Obstetrics and Gynecology | Admitting: Obstetrics and Gynecology

## 2021-01-21 ENCOUNTER — Other Ambulatory Visit: Payer: Self-pay

## 2021-01-21 DIAGNOSIS — Z3A21 21 weeks gestation of pregnancy: Secondary | ICD-10-CM | POA: Insufficient documentation

## 2021-01-21 DIAGNOSIS — O26892 Other specified pregnancy related conditions, second trimester: Secondary | ICD-10-CM | POA: Diagnosis not present

## 2021-01-21 DIAGNOSIS — R1031 Right lower quadrant pain: Secondary | ICD-10-CM | POA: Diagnosis not present

## 2021-01-21 LAB — URINALYSIS, COMPLETE (UACMP) WITH MICROSCOPIC
Glucose, UA: NEGATIVE mg/dL
Hgb urine dipstick: NEGATIVE
Leukocytes,Ua: NEGATIVE
Nitrite: NEGATIVE
Protein, ur: NEGATIVE mg/dL
Specific Gravity, Urine: 1.025 (ref 1.005–1.030)
pH: 6.5 (ref 5.0–8.0)

## 2021-01-21 LAB — RUPTURE OF MEMBRANE (ROM)PLUS: Rom Plus: NEGATIVE

## 2021-01-21 MED ORDER — OXYCODONE-ACETAMINOPHEN 5-325 MG PO TABS
1.0000 | ORAL_TABLET | Freq: Four times a day (QID) | ORAL | Status: DC | PRN
  Administered 2021-01-21: 1 via ORAL
  Filled 2021-01-21: qty 1

## 2021-01-21 NOTE — Telephone Encounter (Signed)
Call transferred from front desk-   OB 21 3/7  Pt is having right sided pain x 1 hour. Comes and goes. Feels like menstrual cramps. She pushed fluids and had a hot shower with no relief.   When she stood up she had a small amount of fluid came out of her vagina. It was clear and did not smell like urine.   No n/v No fever No vb + for a stuffy nose - she states allergies  + FM  Advised to be evaluated at [facility]. Pt voiced understanding.

## 2021-01-21 NOTE — OB Triage Note (Signed)
Pt discharged to home and left unit ambulatory with all personal belongings to be driven home by herself in a private passenger vehicle. OB triage complete.

## 2021-01-21 NOTE — OB Triage Note (Signed)
Pt presents c/o right sided pain that comes and goes that started 4 hours ago. Pt states the pain gets real tight for about a minute to a minute in a half. Pt states she has drank plenty of water today. Pt tried to lay on her left side and  pt also tried to take a warm shower to see if she could get relief with no relief. Pt denies any bleeding. Pt has also had leaking of clear fluid. Pt reports positive fetal movement. VSS. Will continue to monitor. FHT 165

## 2021-01-21 NOTE — OB Triage Note (Signed)
Dr. Marcelline Mates notified by phone of chief complaint by Lanae Crumbly, RN (shift coordinator) of chief complaint. Pt reports her pain is subsiding and was sharp when moving from a sitting to standing position. New order given for pt to be discharged to home and encouraged to keep all remaining visits. I, RN will notify patient on plan of care.

## 2021-01-22 NOTE — Final Progress Note (Signed)
L&D OB Triage Note  Dawn Morris is a 30 y.o. G3P1011 female at [redacted]w[redacted]d EDD Estimated Date of Delivery: 05/31/21 who presented to triage for complaints of RLQ pain that radiates down to her leg.  Pain comes and goes. Attempted to help with warm shower and took Tylenol earlier today. She denied vaginal bleeding, contractions, and has begun noting fetal movement.  She did report leaking clear fluid today. Also denied nausea/vomiting, fevers, or vaginal discharge.She was evaluated by the nurses with no significant findings. Vital signs stable.  Fetal heart tones were obtained. .Marland KitchenShe was treated with PO Percocet.   FETAL ASSESSMENT (NST INTERPRETATION): Indications: patient reassurance  Mode: External Baseline Rate (A): 165 bpm (fht)           Contraction Frequency (min): none  Impression:    Labs:  Results for orders placed or performed during the hospital encounter of 01/21/21  ROM Plus ([facility] only)  Result Value Ref Range   Rom Plus NEGATIVE   Urinalysis, Complete w Microscopic  Result Value Ref Range   Color, Urine YELLOW YELLOW   APPearance CLEAR CLEAR   Specific Gravity, Urine 1.025 1.005 - 1.030   pH 6.5 5.0 - 8.0   Glucose, UA NEGATIVE NEGATIVE mg/dL   Hgb urine dipstick NEGATIVE NEGATIVE   Bilirubin Urine SMALL (A) NEGATIVE   Ketones, ur TRACE (A) NEGATIVE mg/dL   Protein, ur NEGATIVE NEGATIVE mg/dL   Nitrite NEGATIVE NEGATIVE   Leukocytes,Ua NEGATIVE NEGATIVE   Squamous Epithelial / LPF 0-5 0 - 5   WBC, UA 0-5 0 - 5 WBC/hpf   RBC / HPF 0-5 0 - 5 RBC/hpf   Bacteria, UA RARE (A) NONE SEEN   Mucus PRESENT      Plan: Patient noted pain did not improve much with Percocet.  Offered muscle relaxer which she declined. She was discharged home. Advised to utilize warm compresses/baths and to continue use of Tylenol. Also encouraged stretching. Continue routine prenatal care. Follow up with OB/GYN as previously scheduled.     ARubie Maid MD [facility]

## 2021-01-28 NOTE — Telephone Encounter (Signed)
fyi

## 2021-01-29 ENCOUNTER — Encounter: Payer: Medicaid Other | Admitting: Certified Nurse Midwife

## 2021-02-04 ENCOUNTER — Telehealth: Payer: Self-pay | Admitting: Certified Nurse Midwife

## 2021-02-04 NOTE — Telephone Encounter (Signed)
Patient called about FMLA paperwork, states that she is aware she may need to sign forms. Pt has apt in the morning at 9.

## 2021-02-05 ENCOUNTER — Encounter: Payer: Self-pay | Admitting: Certified Nurse Midwife

## 2021-02-05 ENCOUNTER — Other Ambulatory Visit: Payer: Self-pay

## 2021-02-05 ENCOUNTER — Ambulatory Visit (INDEPENDENT_AMBULATORY_CARE_PROVIDER_SITE_OTHER): Payer: Medicaid Other | Admitting: Certified Nurse Midwife

## 2021-02-05 VITALS — BP 152/100 | HR 87 | Wt 159.1 lb

## 2021-02-05 DIAGNOSIS — Z3482 Encounter for supervision of other normal pregnancy, second trimester: Secondary | ICD-10-CM

## 2021-02-05 DIAGNOSIS — Z3A23 23 weeks gestation of pregnancy: Secondary | ICD-10-CM

## 2021-02-05 LAB — POCT URINALYSIS DIPSTICK OB
Bilirubin, UA: NEGATIVE
Blood, UA: NEGATIVE
Glucose, UA: NEGATIVE
Ketones, UA: NEGATIVE
Leukocytes, UA: NEGATIVE
Nitrite, UA: NEGATIVE
POC,PROTEIN,UA: NEGATIVE
Spec Grav, UA: 1.015 (ref 1.010–1.025)
Urobilinogen, UA: 0.2 E.U./dL
pH, UA: 7 (ref 5.0–8.0)

## 2021-02-05 MED ORDER — SERTRALINE HCL 25 MG PO TABS: 25.0000 mg | ORAL_TABLET | Freq: Every day | ORAL | 1 refills | Status: DC
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
## 2021-03-03 ENCOUNTER — Telehealth: Payer: Self-pay | Admitting: Certified Nurse Midwife

## 2021-03-03 ENCOUNTER — Encounter: Payer: Self-pay | Admitting: Certified Nurse Midwife

## 2021-03-03 ENCOUNTER — Encounter: Payer: Medicaid Other | Admitting: Certified Nurse Midwife

## 2021-03-03 DIAGNOSIS — Z3483 Encounter for supervision of other normal pregnancy, third trimester: Secondary | ICD-10-CM

## 2021-03-03 DIAGNOSIS — Z3A28 28 weeks gestation of pregnancy: Secondary | ICD-10-CM

## 2021-03-03 DIAGNOSIS — Z23 Encounter for immunization: Secondary | ICD-10-CM

## 2021-03-03 NOTE — Telephone Encounter (Signed)
We called Dawn Morris to check up on her and see why she missed her appointment.  Dawn Morris called back and stated she thought her appointment was Thursday.  I went ahead and rescheduled her but Dawn Morris stated she wanted to talk to Dawn Morris about some excruciating pains she was having.  Dawn Morris states she was given a letter by Dr. Amalia Hailey that stated she needed a standing desk while at work and no prolong sitting.  Dawn Morris states the pain is so bad she can't even do that anymore and she is having to call out of work a lot.  Dawn Morris wants to speak to Dawn Morris about this and states it can't wait until her next appointment.  Please advise.

## 2021-03-05 ENCOUNTER — Ambulatory Visit (INDEPENDENT_AMBULATORY_CARE_PROVIDER_SITE_OTHER): Payer: Medicaid Other | Admitting: Certified Nurse Midwife

## 2021-03-05 ENCOUNTER — Other Ambulatory Visit: Payer: Medicaid Other

## 2021-03-05 ENCOUNTER — Other Ambulatory Visit: Payer: Self-pay

## 2021-03-05 ENCOUNTER — Encounter: Payer: Medicaid Other | Admitting: Certified Nurse Midwife

## 2021-03-05 VITALS — BP 128/68 | HR 94 | Wt 151.2 lb

## 2021-03-05 DIAGNOSIS — Z3482 Encounter for supervision of other normal pregnancy, second trimester: Secondary | ICD-10-CM | POA: Diagnosis not present

## 2021-03-05 DIAGNOSIS — Z23 Encounter for immunization: Secondary | ICD-10-CM

## 2021-03-05 DIAGNOSIS — Z3A27 27 weeks gestation of pregnancy: Secondary | ICD-10-CM

## 2021-03-05 DIAGNOSIS — O1493 Unspecified pre-eclampsia, third trimester: Secondary | ICD-10-CM

## 2021-03-05 LAB — POCT URINALYSIS DIPSTICK OB
Bilirubin, UA: NEGATIVE
Blood, UA: NEGATIVE
Glucose, UA: NEGATIVE
Ketones, UA: NEGATIVE
Nitrite, UA: NEGATIVE
POC,PROTEIN,UA: NEGATIVE
Spec Grav, UA: 1.01 (ref 1.010–1.025)
Urobilinogen, UA: 0.2 E.U./dL
pH, UA: 7.5 (ref 5.0–8.0)

## 2021-03-05 MED ORDER — TETANUS-DIPHTH-ACELL PERTUSSIS 5-2.5-18.5 LF-MCG/0.5 IM SUSY
0.5000 mL | PREFILLED_SYRINGE | Freq: Once | INTRAMUSCULAR | Status: AC
  Administered 2021-03-05: 0.5 mL via INTRAMUSCULAR

## 2021-03-05 NOTE — Addendum Note (Signed)
Addended by: Ova Freshwater on: 03/05/2021 11:20 AM   Modules accepted: Orders

## 2021-03-05 NOTE — Patient Instructions (Signed)
Oral Glucose Tolerance Test During Pregnancy Why am I having this test? The oral glucose tolerance test (OGTT) is done to check how your body processes blood sugar (glucose). This is one of several tests used to diagnose diabetes that develops during pregnancy (gestational diabetes mellitus). Gestational diabetes is a short-term form of diabetes that some women develop while they are pregnant. It usually occurs during the second trimester of pregnancy and goes away after delivery. Testing, or screening, for gestational diabetes usually occurs at weeks 24-28 of pregnancy. You may have the OGTT test after having a 1-hour glucose screening test if the results from that test indicate that you may have gestational diabetes. This test may also be needed if: You have a history of gestational diabetes. There is a history of giving birth to very large babies or of losing pregnancies (having stillbirths). You have signs and symptoms of diabetes, such as: Changes in your eyesight. Tingling or numbness in your hands or feet. Changes in hunger, thirst, and urination, and these are not explained by your pregnancy. What is being tested? This test measures the amount of glucose in your blood at different times during a period of 3 hours. This shows how well your body can process glucose. What kind of sample is taken? Blood samples are required for this test. They are usually collected by inserting a needle into a blood vessel. How do I prepare for this test? For 3 days before your test, eat normally. Have plenty of carbohydrate-rich foods. Follow instructions from your health care provider about: Eating or drinking restrictions on the day of the test. You may be asked not to eat or drink anything other than water (to fast) starting 8-10 hours before the test. Changing or stopping your regular medicines. Some medicines may interfere with this test. Tell a health care provider about: All medicines you are taking,  including vitamins, herbs, eye drops, creams, and over-the-counter medicines. Any blood disorders you have. Any surgeries you have had. Any medical conditions you have. What happens during the test? First, your blood glucose will be measured. This is referred to as your fasting blood glucose because you fasted before the test. Then, you will drink a glucose solution that contains a certain amount of glucose. Your blood glucose will be measured again 1, 2, and 3 hours after you drink the solution. This test takes about 3 hours to complete. You will need to stay at the testing location during this time. During the testing period: Do not eat or drink anything other than the glucose solution. Do not exercise. Do not use any products that contain nicotine or tobacco, such as cigarettes, e-cigarettes, and chewing tobacco. These can affect your test results. If you need help quitting, ask your health care provider. The testing procedure may vary among health care providers and hospitals. How are the results reported? Your results will be reported as milligrams of glucose per deciliter of blood (mg/dL) or millimoles per liter (mmol/L). There is more than one source for screening and diagnosis reference values used to diagnose gestational diabetes. Your health care provider will compare your results to normal values that were established after testing a large group of people (reference values). Reference values may vary among labs and hospitals. For this test (Carpenter-Coustan), reference values are: Fasting: 95 mg/dL (5.3 mmol/L). 1 hour: 180 mg/dL (10.0 mmol/L). 2 hour: 155 mg/dL (8.6 mmol/L). 3 hour: 140 mg/dL (7.8 mmol/L). What do the results mean? Results below the reference values are considered  normal. If two or more of your blood glucose levels are at or above the reference values, you may be diagnosed with gestational diabetes. If only one level is high, your health care provider may suggest  repeat testing or other tests to confirm a diagnosis. Talk with your health care provider about what your results mean. Questions to ask your health care provider Ask your health care provider, or the department that is doing the test: When will my results be ready? How will I get my results? What are my treatment options? What other tests do I need? What are my next steps? Summary The oral glucose tolerance test (OGTT) is one of several tests used to diagnose diabetes that develops during pregnancy (gestational diabetes mellitus). Gestational diabetes is a short-term form of diabetes that some women develop while they are pregnant. You may have the OGTT test after having a 1-hour glucose screening test if the results from that test show that you may have gestational diabetes. You may also have this test if you have any symptoms or risk factors for this type of diabetes. Talk with your health care provider about what your results mean. This information is not intended to replace advice given to you by your health care provider. Make sure you discuss any questions you have with your health care provider. Document Revised: 10/10/2019 Document Reviewed: 10/10/2019 Elsevier Patient Education  2022 [employer].

## 2021-03-05 NOTE — Progress Notes (Addendum)
ROB doing well. Feels good movement. 28 wk labs today: Glucose screen/RPR/CBC. Tdap, Blood transfusion consent completed, all questions answered. Ready set baby reviewed, see check list for topics covered. Sample birth plan given, will follow up in upcoming visits. Discussed birth control after delivery, information pamphlet given. U/s in 4 wks due to poor wt gain and size less than dates  BP elevated today. Pt denies any signs and symptoms of PRE. Labs completed today. She has mild headache, denies epigastric pain , negative clonus, reflexes +1 bilaterally. RPT 128/68  RPT BP Follow up 2 wk  ROB or sooner if needed.    Dawn Morris, CNM

## 2021-03-06 LAB — COMPREHENSIVE METABOLIC PANEL
ALT: 40 IU/L — ABNORMAL HIGH (ref 0–32)
AST: 25 IU/L (ref 0–40)
Albumin/Globulin Ratio: 1.3 (ref 1.2–2.2)
Albumin: 3.6 g/dL — ABNORMAL LOW (ref 3.8–4.8)
Alkaline Phosphatase: 154 IU/L — ABNORMAL HIGH (ref 44–121)
BUN/Creatinine Ratio: 8 — ABNORMAL LOW (ref 9–23)
BUN: 5 mg/dL — ABNORMAL LOW (ref 6–20)
Bilirubin Total: 0.3 mg/dL (ref 0.0–1.2)
CO2: 22 mmol/L (ref 20–29)
Calcium: 8.9 mg/dL (ref 8.7–10.2)
Chloride: 100 mmol/L (ref 96–106)
Creatinine, Ser: 0.64 mg/dL (ref 0.57–1.00)
Globulin, Total: 2.7 g/dL (ref 1.5–4.5)
Glucose: 218 mg/dL — ABNORMAL HIGH (ref 70–99)
Potassium: 4 mmol/L (ref 3.5–5.2)
Sodium: 137 mmol/L (ref 134–144)
Total Protein: 6.3 g/dL (ref 6.0–8.5)
eGFR: 121 mL/min/{1.73_m2} (ref >59)

## 2021-03-06 LAB — CBC
Hematocrit: 33.9 % — ABNORMAL LOW (ref 34.0–46.6)
Hemoglobin: 11.3 g/dL (ref 11.1–15.9)
MCH: 30.1 pg (ref 26.6–33.0)
MCHC: 33.3 g/dL (ref 31.5–35.7)
MCV: 90 fL (ref 79–97)
Platelets: 316 10*3/uL (ref 150–450)
RBC: 3.76 x10E6/uL — ABNORMAL LOW (ref 3.77–5.28)
RDW: 11 % — ABNORMAL LOW (ref 11.7–15.4)
WBC: 8.8 10*3/uL (ref 3.4–10.8)

## 2021-03-06 LAB — PROTEIN / CREATININE RATIO, URINE
Creatinine, Urine: 49.8 mg/dL
Protein, Ur: 9.2 mg/dL
Protein/Creat Ratio: 185 mg/g creat (ref 0–200)

## 2021-03-06 LAB — GLUCOSE, 1 HOUR GESTATIONAL: Gestational Diabetes Screen: 236 mg/dL — ABNORMAL HIGH (ref 70–139)

## 2021-03-06 LAB — RPR: RPR Ser Ql: NONREACTIVE

## 2021-03-08 ENCOUNTER — Other Ambulatory Visit: Payer: Self-pay

## 2021-03-08 ENCOUNTER — Other Ambulatory Visit: Payer: Self-pay | Admitting: Certified Nurse Midwife

## 2021-03-08 ENCOUNTER — Ambulatory Visit (INDEPENDENT_AMBULATORY_CARE_PROVIDER_SITE_OTHER): Payer: Medicaid Other | Admitting: Certified Nurse Midwife

## 2021-03-08 VITALS — BP 167/102 | HR 108 | Wt 150.6 lb

## 2021-03-08 DIAGNOSIS — Z0130 Encounter for examination of blood pressure without abnormal findings: Secondary | ICD-10-CM | POA: Diagnosis not present

## 2021-03-08 DIAGNOSIS — O24419 Gestational diabetes mellitus in pregnancy, unspecified control: Secondary | ICD-10-CM

## 2021-03-08 MED ORDER — BLOOD GLUCOSE METER KIT: PACK | 0 refills | Status: DC

## 2021-03-08 MED ORDER — LABETALOL HCL 100 MG PO TABS
100.0000 mg | ORAL_TABLET | Freq: Two times a day (BID) | ORAL | 1 refills | Status: DC
Start: 2021-03-08 — End: 2021-03-19

## 2021-03-08 NOTE — Progress Notes (Signed)
Patient is here for a blood pressure check. Patient denies chest pain, palpitations, shortness of breath or visual disturbances. At previous visit blood pressure was 128/68  after trying 3 times. Today during nurse visit first check blood pressure was 167/102 with a pulse of 108. She does not take any blood pressure medications. Blood pressure given to [facility] and patient put in exam room for evaluation. Note was written for patient to work remotely from home this week per [place].

## 2021-03-08 NOTE — Progress Notes (Signed)
Dr. Marcelline Mates consulted on plan of care. Labetalol 100 mg BID. Follow up nurse visit on Friday.   [name], CNM

## 2021-03-09 ENCOUNTER — Other Ambulatory Visit: Payer: Self-pay

## 2021-03-09 MED ORDER — ACCU-CHEK SMARTVIEW VI STRP: ORAL_STRIP | 12 refills | Status: DC

## 2021-03-09 MED ORDER — ACCU-CHEK NANO SMARTVIEW W/DEVICE KIT: 1.0000 | PACK | 0 refills | Status: DC

## 2021-03-10 ENCOUNTER — Ambulatory Visit (INDEPENDENT_AMBULATORY_CARE_PROVIDER_SITE_OTHER): Payer: Medicaid Other | Admitting: Certified Nurse Midwife

## 2021-03-10 ENCOUNTER — Encounter: Payer: Self-pay | Admitting: Certified Nurse Midwife

## 2021-03-10 ENCOUNTER — Encounter: Payer: Self-pay | Admitting: *Deleted

## 2021-03-10 ENCOUNTER — Other Ambulatory Visit: Payer: Self-pay

## 2021-03-10 ENCOUNTER — Encounter: Payer: Medicaid Other | Admitting: *Deleted

## 2021-03-10 DIAGNOSIS — Z3482 Encounter for supervision of other normal pregnancy, second trimester: Secondary | ICD-10-CM

## 2021-03-10 DIAGNOSIS — O24419 Gestational diabetes mellitus in pregnancy, unspecified control: Secondary | ICD-10-CM

## 2021-03-10 LAB — POCT URINALYSIS DIPSTICK OB
Bilirubin, UA: NEGATIVE
Blood, UA: NEGATIVE
Glucose, UA: NEGATIVE
Ketones, UA: NEGATIVE
Leukocytes, UA: NEGATIVE
Nitrite, UA: NEGATIVE
POC,PROTEIN,UA: NEGATIVE
Spec Grav, UA: 1.015 (ref 1.010–1.025)
Urobilinogen, UA: 0.2 E.U./dL
pH, UA: 6.5 (ref 5.0–8.0)

## 2021-03-10 NOTE — Progress Notes (Signed)
This patient came for Gestational Diabetes class. 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 This appointment was cancelled and she does not want it rescheduled. She agreed to keep her appointment with the dietitian on November 10. Please let us know if we can assist with anything further with this patient.

## 2021-03-11 ENCOUNTER — Other Ambulatory Visit: Payer: Self-pay | Admitting: Certified Nurse Midwife

## 2021-03-11 MED ORDER — ONDANSETRON 4 MG PO TBDP: 4.0000 mg | ORAL_TABLET | Freq: Three times a day (TID) | ORAL | 0 refills | Status: DC | PRN

## 2021-03-12 ENCOUNTER — Encounter: Payer: Medicaid Other | Admitting: Certified Nurse Midwife

## 2021-03-12 ENCOUNTER — Other Ambulatory Visit: Payer: Self-pay

## 2021-03-12 ENCOUNTER — Other Ambulatory Visit: Payer: Medicaid Other

## 2021-03-12 ENCOUNTER — Ambulatory Visit (INDEPENDENT_AMBULATORY_CARE_PROVIDER_SITE_OTHER): Payer: Medicaid Other

## 2021-03-12 VITALS — BP 135/83 | HR 93 | Ht 66.0 in | Wt 155.6 lb

## 2021-03-12 DIAGNOSIS — Z0130 Encounter for examination of blood pressure without abnormal findings: Secondary | ICD-10-CM

## 2021-03-12 NOTE — Progress Notes (Signed)
Dawn Morris presents for BP check. She is compliant with BP medication Labetalol 100mg  one tablet by mouth twice daily. BP today is 135/83   Pt advised to continue taking BP medication as prescribed. Pt voiced understanding. Pt c/o nausea but no vomiting; headaches since starting bp medication and was informed at her eye doctor appt that she has an infection in her eye and needs antibiotics.

## 2021-03-17 ENCOUNTER — Other Ambulatory Visit: Payer: Self-pay

## 2021-03-19 ENCOUNTER — Other Ambulatory Visit: Payer: Self-pay

## 2021-03-19 ENCOUNTER — Ambulatory Visit (INDEPENDENT_AMBULATORY_CARE_PROVIDER_SITE_OTHER): Payer: Medicaid Other | Admitting: Certified Nurse Midwife

## 2021-03-19 ENCOUNTER — Other Ambulatory Visit: Payer: Self-pay | Admitting: Certified Nurse Midwife

## 2021-03-19 VITALS — BP 144/97 | HR 105 | Wt 149.4 lb

## 2021-03-19 DIAGNOSIS — O139 Gestational [pregnancy-induced] hypertension without significant proteinuria, unspecified trimester: Secondary | ICD-10-CM

## 2021-03-19 DIAGNOSIS — Z3483 Encounter for supervision of other normal pregnancy, third trimester: Secondary | ICD-10-CM

## 2021-03-19 DIAGNOSIS — Z3A29 29 weeks gestation of pregnancy: Secondary | ICD-10-CM

## 2021-03-19 DIAGNOSIS — O24410 Gestational diabetes mellitus in pregnancy, diet controlled: Secondary | ICD-10-CM

## 2021-03-19 LAB — POCT URINALYSIS DIPSTICK OB
Bilirubin, UA: NEGATIVE
Blood, UA: NEGATIVE
Glucose, UA: NEGATIVE
Ketones, UA: NEGATIVE
Leukocytes, UA: NEGATIVE
Nitrite, UA: NEGATIVE
POC,PROTEIN,UA: NEGATIVE
Spec Grav, UA: 1.01 (ref 1.010–1.025)
Urobilinogen, UA: 0.2 E.U./dL
pH, UA: 7 (ref 5.0–8.0)

## 2021-03-19 MED ORDER — ACCU-CHEK NANO SMARTVIEW W/DEVICE KIT: 1.0000 | PACK | 0 refills | Status: DC

## 2021-03-19 MED ORDER — LABETALOL HCL 200 MG PO TABS: 200.0000 mg | ORAL_TABLET | Freq: Two times a day (BID) | ORAL | 4 refills | Status: DC
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
## 2021-03-19 NOTE — Patient Instructions (Signed)
Gestational Diabetes Mellitus, Diagnosis Gestational diabetes mellitus is a form of diabetes. It can happen when you are pregnant. The diabetes goes away after you give birth. If you do not get treated for this condition, it may cause problems for you or your baby. What are the causes? This condition is caused by changes in your body when you are pregnant. When these happen: A part of the body called the pancreas does not make enough insulin. The body cannot use insulin in the right way. Sugars cannot get into cells in your body. The sugars stay in your blood. This leads to high blood sugar. What increases the risk? Being older than age 35 when pregnant. Having someone with diabetes in your family. Too much body weight. Having had this condition in the past. Polycystic ovary syndrome. Being pregnant with more than one baby. What are the signs or symptoms? Being thirsty often. Being hungry often. Needing to pee more often. How is this treated? Eat a healthy diet. Get more exercise. Check your blood sugar often. Take insulin and other medicines, if needed. Work with an Cytogeneticist on this condition, if told. Follow these instructions at home: Learn about your diabetes Ask your doctor: How often should I check my blood sugar? Where do I get the equipment? What medicines do I need? When should I take them? Do I need to meet with an educator? Who can I call if I have questions? Where can I find a support group? General instructions Take medicines only as told by your doctor. Stay at a healthy weight. Drink enough fluid to keep your pee pale yellow. Wear an alert bracelet or carry a card that shows you have this condition. Keep all follow-up visits. Where to find more information American Diabetes Association (ADA): diabetes.org Association of Diabetes Care & Education Specialists (ADCES): diabeteseducator.org Centers for Disease Control and Prevention (CDC): StoreMirror.com.cy American  Pregnancy Association: americanpregnancy.org U.S. Department of Agriculture MyPlate: http://www.wilson-mendoza.org/ Contact a doctor if: Your blood sugar is at or above 240 mg/dL (13.3 mmol/L). Your blood sugar is at or above 200 mg/dL (11.1 mmol/L) and you have ketones in your pee. You have a fever. You are sick for 2 days or more and you do not get better. You have either of these problems for more than 6 hours: You vomit every time you eat or drink. You have watery poop (diarrhea). Get help right away if: You cannot think clearly. You are not breathing well. You have a lot of ketones in your pee. Your baby seems to move less than normal. Abnormal fluid or blood starts to come out of your vagina. You start having contractions before your due date. You may feel your belly tighten. You have a very bad headache. These symptoms may be an emergency. Get help right away. Call your local emergency services (911 in the [place] Do not wait to see if the symptoms will go away. Do not drive yourself to the hospital. Summary Gestational diabetes is a form of diabetes. It can happen when you are pregnant. This condition occurs when your body cannot make or use insulin in the right way. Eat a healthy diet, exercise, and use medicines or insulin as told by your doctor. Tell your doctor if your blood sugar is high, you have a fever, or you vomit every time you eat or drink. Get help right away if you cannot think clearly, you are not breathing well, or your baby seems to move less than normal. This information  is not intended to replace advice given to you by your health care provider. Make sure you discuss any questions you have with your health care provider. Document Revised: 10/07/2019 Document Reviewed: 10/07/2019 Elsevier Patient Education  [place].

## 2021-03-25 ENCOUNTER — Ambulatory Visit: Payer: Medicaid Other | Admitting: Skilled Nursing Facility1

## 2021-03-26 ENCOUNTER — Telehealth (INDEPENDENT_AMBULATORY_CARE_PROVIDER_SITE_OTHER): Payer: Medicaid Other | Admitting: Certified Nurse Midwife

## 2021-03-26 DIAGNOSIS — O133 Gestational [pregnancy-induced] hypertension without significant proteinuria, third trimester: Secondary | ICD-10-CM

## 2021-03-26 DIAGNOSIS — O24410 Gestational diabetes mellitus in pregnancy, diet controlled: Secondary | ICD-10-CM

## 2021-03-26 DIAGNOSIS — Z3A30 30 weeks gestation of pregnancy: Secondary | ICD-10-CM

## 2021-03-26 DIAGNOSIS — Z3483 Encounter for supervision of other normal pregnancy, third trimester: Secondary | ICD-10-CM

## 2021-03-28 DIAGNOSIS — O133 Gestational [pregnancy-induced] hypertension without significant proteinuria, third trimester: Secondary | ICD-10-CM | POA: Insufficient documentation

## 2021-03-28 DIAGNOSIS — O24419 Gestational diabetes mellitus in pregnancy, unspecified control: Secondary | ICD-10-CM | POA: Insufficient documentation
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
## 2021-03-31 ENCOUNTER — Other Ambulatory Visit: Payer: Medicaid Other

## 2021-03-31 ENCOUNTER — Other Ambulatory Visit: Payer: Self-pay | Admitting: Certified Nurse Midwife

## 2021-03-31 ENCOUNTER — Other Ambulatory Visit: Payer: Self-pay

## 2021-03-31 ENCOUNTER — Ambulatory Visit (INDEPENDENT_AMBULATORY_CARE_PROVIDER_SITE_OTHER): Payer: Medicaid Other | Admitting: Certified Nurse Midwife

## 2021-03-31 ENCOUNTER — Encounter: Payer: Self-pay | Admitting: Certified Nurse Midwife

## 2021-03-31 VITALS — BP 116/78 | HR 97 | Wt 147.0 lb

## 2021-03-31 DIAGNOSIS — Z3483 Encounter for supervision of other normal pregnancy, third trimester: Secondary | ICD-10-CM

## 2021-03-31 DIAGNOSIS — O24410 Gestational diabetes mellitus in pregnancy, diet controlled: Secondary | ICD-10-CM

## 2021-03-31 DIAGNOSIS — O139 Gestational [pregnancy-induced] hypertension without significant proteinuria, unspecified trimester: Secondary | ICD-10-CM

## 2021-03-31 DIAGNOSIS — Z3A31 31 weeks gestation of pregnancy: Secondary | ICD-10-CM

## 2021-03-31 LAB — POCT URINALYSIS DIPSTICK OB
Bilirubin, UA: NEGATIVE
Blood, UA: NEGATIVE
Glucose, UA: NEGATIVE
Ketones, UA: NEGATIVE
Leukocytes, UA: NEGATIVE
Nitrite, UA: NEGATIVE
POC,PROTEIN,UA: NEGATIVE
Spec Grav, UA: 1.015 (ref 1.010–1.025)
Urobilinogen, UA: 0.2 E.U./dL
pH, UA: 6.5 (ref 5.0–8.0)

## 2021-03-31 MED ORDER — SERTRALINE HCL 25 MG PO TABS
25.0000 mg | ORAL_TABLET | Freq: Every day | ORAL | 5 refills | Status: DC
Start: 2021-03-31 — End: 2021-04-07

## 2021-03-31 NOTE — Progress Notes (Signed)
ROB doing well, feeling good movement. PT state she tried to send log via my chart. She will bring it in and drop it off later today. She states her sugars fasting have been 65-82 and her 2 hr have been 120 or less except she had 2 at 125. She has mfm visit on Monday for growth u/s . She will follow up in 2 wks for ROB>   [name], CNM

## 2021-03-31 NOTE — Patient Instructions (Signed)
Fetal Movement Counts Patient Name: ________________________________________________ Patient Due Date: ____________________ What is a fetal movement count? A fetal movement count is the number of times that you feel your baby move during a certain amount of time. This may also be called a fetal kick count. A fetal movement count is recommended for every pregnant woman. You may be asked to start counting fetal movements as early as week 28 of your pregnancy. Pay attention to when your baby is most active. You may notice your baby's sleep and wake cycles. You may also notice things that make your baby move more. You should do a fetal movement count: When your baby is normally most active. At the same time each day. A good time to count movements is while you are resting, after having something to eat and drink. How do I count fetal movements? Find a quiet, comfortable area. Sit, or lie down on your side. Write down the date, the start time and stop time, and the number of movements that you felt between those two times. Take this information with you to your health care visits. Write down your start time when you feel the first movement. Count kicks, flutters, swishes, rolls, and jabs. You should feel at least 10 movements. You may stop counting after you have felt 10 movements, or if you have been counting for 2 hours. Write down the stop time. If you do not feel 10 movements in 2 hours, contact your health care provider for further instructions. Your health care provider may want to do additional tests to assess your baby's well-being. Contact a health care provider if: You feel fewer than 10 movements in 2 hours. Your baby is not moving like he or she usually does. Date: ____________ Start time: ____________ Stop time: ____________ Movements: ____________ Date: ____________ Start time: ____________ Stop time: ____________ Movements: ____________ Date: ____________ Start time: ____________ Stop  time: ____________ Movements: ____________ Date: ____________ Start time: ____________ Stop time: ____________ Movements: ____________ Date: ____________ Start time: ____________ Stop time: ____________ Movements: ____________ Date: ____________ Start time: ____________ Stop time: ____________ Movements: ____________ Date: ____________ Start time: ____________ Stop time: ____________ Movements: ____________ Date: ____________ Start time: ____________ Stop time: ____________ Movements: ____________ Date: ____________ Start time: ____________ Stop time: ____________ Movements: ____________ This information is not intended to replace advice given to you by your health care provider. Make sure you discuss any questions you have with your health care provider. Document Revised: 12/20/2018 Document Reviewed: 12/20/2018 Elsevier Patient Education  [place].

## 2021-04-02 ENCOUNTER — Encounter: Payer: Self-pay | Admitting: *Deleted

## 2021-04-02 ENCOUNTER — Other Ambulatory Visit: Payer: Self-pay | Admitting: Certified Nurse Midwife

## 2021-04-02 ENCOUNTER — Ambulatory Visit: Payer: Medicaid Other | Admitting: *Deleted

## 2021-04-02 ENCOUNTER — Ambulatory Visit (HOSPITAL_BASED_OUTPATIENT_CLINIC_OR_DEPARTMENT_OTHER): Payer: Medicaid Other | Admitting: Obstetrics

## 2021-04-02 ENCOUNTER — Other Ambulatory Visit: Payer: Self-pay | Admitting: *Deleted

## 2021-04-02 ENCOUNTER — Ambulatory Visit: Payer: Medicaid Other | Attending: Obstetrics and Gynecology

## 2021-04-02 ENCOUNTER — Other Ambulatory Visit: Payer: Self-pay

## 2021-04-02 VITALS — BP 123/84 | HR 88

## 2021-04-02 DIAGNOSIS — O24410 Gestational diabetes mellitus in pregnancy, diet controlled: Secondary | ICD-10-CM | POA: Insufficient documentation

## 2021-04-02 DIAGNOSIS — O133 Gestational [pregnancy-induced] hypertension without significant proteinuria, third trimester: Secondary | ICD-10-CM

## 2021-04-02 DIAGNOSIS — Z3A31 31 weeks gestation of pregnancy: Secondary | ICD-10-CM | POA: Insufficient documentation

## 2021-04-02 DIAGNOSIS — Z3689 Encounter for other specified antenatal screening: Secondary | ICD-10-CM

## 2021-04-02 DIAGNOSIS — O139 Gestational [pregnancy-induced] hypertension without significant proteinuria, unspecified trimester: Secondary | ICD-10-CM

## 2021-04-02 NOTE — Progress Notes (Signed)
MFM Note  Dawn Morris was seen for an ultrasound and consultation due to gestational hypertension that is treated with labetalol 200 mg twice a day and recently diagnosed diet-controlled gestational diabetes.  Her blood pressure today was 123/84.  She denies any other problems in her current pregnancy.  She had a cell free DNA test drawn earlier in her pregnancy that indicated a low risk for trisomy 51, 25, and 13.  She was informed that the fetal growth and amniotic fluid level appears appropriate for her gestational age.  The views of the fetal anatomy were limited due to her advanced gestational age.  The following were discussed today:  Gestational hypertension  The implications and management of gestational hypertension was discussed in detail with the patient today.  The increased risk of preeclampsia and fetal growth restriction due to gestational hypertension was discussed.  As her blood pressures are within normal limits today, she should continue taking labetalol 200 mg twice a day until delivery.  Due to gestational hypertension, she should continue weekly fetal testing using either BPP's or NSTs.  The patient stated that she will have the weekly fetal testing performed in your office.    Please contact our office should your office be unable to perform these weekly tests for her.  Due to gestational hypertension, delivery should probably occur at around 37 weeks.  Gestational diabetes  The implications and management of diabetes in pregnancy was discussed in detail with the patient. She was advised that our goals for her fingerstick values are fasting values of 90-95 or less and two-hour postprandials of 120 or less.  Should her fingerstick values be above these values, she may have to be started on insulin or metformin to help her achieve better glycemic control. The patient was advised that getting her fingerstick values as close to these goals as possible would provide  her with the most optimal obstetrical outcome.  A follow-up growth scan was scheduled in our office in 4 weeks.  The patient stated that all of her questions have been answered to her complete satisfaction.  A total of 30 minutes was spent counseling and coordinating the care for this patient.  Greater than 50% of the time was spent in direct face-to-face contact.  Recommendations:  Continue labetalol 200 mg twice a day for the duration of her pregnancy Weekly fetal testing using either BPP's or NSTs Delivery at 37 weeks

## 2021-04-04 ENCOUNTER — Encounter: Payer: Self-pay | Admitting: Certified Nurse Midwife

## 2021-04-06 ENCOUNTER — Encounter: Payer: Self-pay | Admitting: Certified Nurse Midwife

## 2021-04-07 ENCOUNTER — Other Ambulatory Visit: Payer: Self-pay

## 2021-04-07 MED ORDER — SERTRALINE HCL 25 MG PO TABS
25.0000 mg | ORAL_TABLET | Freq: Every day | ORAL | 5 refills | Status: DC
Start: 2021-04-07 — End: 2021-08-04

## 2021-04-07 MED ORDER — ONDANSETRON 4 MG PO TBDP: 4.0000 mg | ORAL_TABLET | Freq: Three times a day (TID) | ORAL | 0 refills | Status: DC | PRN

## 2021-04-10 ENCOUNTER — Encounter: Payer: Self-pay | Admitting: Certified Nurse Midwife

## 2021-04-14 ENCOUNTER — Other Ambulatory Visit: Payer: Medicaid Other

## 2021-04-14 ENCOUNTER — Other Ambulatory Visit: Payer: Self-pay

## 2021-04-14 ENCOUNTER — Ambulatory Visit (INDEPENDENT_AMBULATORY_CARE_PROVIDER_SITE_OTHER): Payer: Medicaid Other | Admitting: Certified Nurse Midwife

## 2021-04-14 VITALS — BP 157/94 | HR 90 | Wt 148.9 lb

## 2021-04-14 DIAGNOSIS — O24410 Gestational diabetes mellitus in pregnancy, diet controlled: Secondary | ICD-10-CM | POA: Diagnosis not present

## 2021-04-14 DIAGNOSIS — Z3A33 33 weeks gestation of pregnancy: Secondary | ICD-10-CM

## 2021-04-14 DIAGNOSIS — O133 Gestational [pregnancy-induced] hypertension without significant proteinuria, third trimester: Secondary | ICD-10-CM | POA: Diagnosis not present

## 2021-04-14 DIAGNOSIS — O0993 Supervision of high risk pregnancy, unspecified, third trimester: Secondary | ICD-10-CM

## 2021-04-14 LAB — POCT URINALYSIS DIPSTICK OB
Bilirubin, UA: NEGATIVE
Blood, UA: NEGATIVE
Glucose, UA: NEGATIVE
Ketones, UA: NEGATIVE
Leukocytes, UA: NEGATIVE
Nitrite, UA: NEGATIVE
POC,PROTEIN,UA: NEGATIVE
Spec Grav, UA: 1.015 (ref 1.010–1.025)
Urobilinogen, UA: 0.2 E.U./dL
pH, UA: 7.5 (ref 5.0–8.0)

## 2021-04-14 LAB — FETAL NONSTRESS TEST

## 2021-04-14 NOTE — Progress Notes (Signed)
ROB and NST for GDM-diet and gestational hypertension.   NST: reactive  Baseline:135 Variability: moderate Accels present Decels absent  Ctx: irritability  Pt requesting BPP instead of NST next visit. Per MFM recommendations BPP or nst wkly. Will attempt to do BPP next week. She has follow up growth 12/16.   BS reviewed fasting 89-94 ( per pt no log) 2 hr pp wnl she state she had a few 122-127 range.  Pt encouraged to bring in log.   BP today 131/86. Denies signs & symptoms pre eclampsia.   Follow up 1 wk  [name], CNM

## 2021-04-14 NOTE — Patient Instructions (Signed)

## 2021-04-15 ENCOUNTER — Other Ambulatory Visit: Payer: Self-pay

## 2021-04-15 DIAGNOSIS — O133 Gestational [pregnancy-induced] hypertension without significant proteinuria, third trimester: Secondary | ICD-10-CM

## 2021-04-15 DIAGNOSIS — O24410 Gestational diabetes mellitus in pregnancy, diet controlled: Secondary | ICD-10-CM

## 2021-04-19 ENCOUNTER — Other Ambulatory Visit: Payer: Self-pay

## 2021-04-19 ENCOUNTER — Other Ambulatory Visit: Payer: Medicaid Other

## 2021-04-19 ENCOUNTER — Ambulatory Visit (INDEPENDENT_AMBULATORY_CARE_PROVIDER_SITE_OTHER): Payer: Medicaid Other | Admitting: Certified Nurse Midwife

## 2021-04-19 VITALS — BP 123/82 | HR 97 | Wt 154.1 lb

## 2021-04-19 DIAGNOSIS — Z3A34 34 weeks gestation of pregnancy: Secondary | ICD-10-CM

## 2021-04-19 DIAGNOSIS — O0993 Supervision of high risk pregnancy, unspecified, third trimester: Secondary | ICD-10-CM

## 2021-04-19 LAB — POCT URINALYSIS DIPSTICK OB
Bilirubin, UA: NEGATIVE
Blood, UA: NEGATIVE
Glucose, UA: NEGATIVE
Ketones, UA: NEGATIVE
Leukocytes, UA: NEGATIVE
Nitrite, UA: NEGATIVE
Spec Grav, UA: 1.02 (ref 1.010–1.025)
Urobilinogen, UA: 0.2 E.U./dL
pH, UA: 6 (ref 5.0–8.0)

## 2021-04-19 NOTE — Progress Notes (Signed)
ROB doing well, feeling good movement. Pt has MFM appointment tomorrow for BPP .Recommendations for antinatal testing BPP or NST weekly.  BS fasting's 95 or less, 2 hr pp 120 or less. She is taking 200 mg labetalol BID which is managing her Bps well. She will see Dr. Amalia Hailey next week due to scheduled time off.    [name], CNM

## 2021-04-20 ENCOUNTER — Other Ambulatory Visit: Payer: Self-pay

## 2021-04-20 ENCOUNTER — Other Ambulatory Visit: Payer: Self-pay | Admitting: Certified Nurse Midwife

## 2021-04-20 ENCOUNTER — Encounter: Payer: Self-pay | Admitting: Certified Nurse Midwife

## 2021-04-20 ENCOUNTER — Ambulatory Visit: Payer: Medicaid Other | Attending: Certified Nurse Midwife

## 2021-04-20 DIAGNOSIS — Z3A34 34 weeks gestation of pregnancy: Secondary | ICD-10-CM | POA: Diagnosis not present

## 2021-04-20 DIAGNOSIS — O24410 Gestational diabetes mellitus in pregnancy, diet controlled: Secondary | ICD-10-CM | POA: Diagnosis not present

## 2021-04-20 DIAGNOSIS — O139 Gestational [pregnancy-induced] hypertension without significant proteinuria, unspecified trimester: Secondary | ICD-10-CM

## 2021-04-20 DIAGNOSIS — Z79899 Other long term (current) drug therapy: Secondary | ICD-10-CM | POA: Diagnosis not present

## 2021-04-20 DIAGNOSIS — O133 Gestational [pregnancy-induced] hypertension without significant proteinuria, third trimester: Secondary | ICD-10-CM | POA: Diagnosis present

## 2021-04-27 ENCOUNTER — Other Ambulatory Visit: Payer: Medicaid Other

## 2021-04-29 ENCOUNTER — Ambulatory Visit (INDEPENDENT_AMBULATORY_CARE_PROVIDER_SITE_OTHER): Payer: Medicaid Other | Admitting: Obstetrics and Gynecology

## 2021-04-29 ENCOUNTER — Other Ambulatory Visit: Payer: Self-pay

## 2021-04-29 VITALS — BP 133/88 | HR 83 | Wt 152.8 lb

## 2021-04-29 DIAGNOSIS — O0993 Supervision of high risk pregnancy, unspecified, third trimester: Secondary | ICD-10-CM

## 2021-04-29 DIAGNOSIS — Z3A35 35 weeks gestation of pregnancy: Secondary | ICD-10-CM

## 2021-04-29 LAB — POCT URINALYSIS DIPSTICK OB
Bilirubin, UA: NEGATIVE
Blood, UA: NEGATIVE
Glucose, UA: NEGATIVE
Ketones, UA: NEGATIVE
Leukocytes, UA: NEGATIVE
Nitrite, UA: NEGATIVE
POC,PROTEIN,UA: NEGATIVE
Spec Grav, UA: 1.02 (ref 1.010–1.025)
Urobilinogen, UA: 0.2 E.U./dL
pH, UA: 7 (ref 5.0–8.0)

## 2021-04-29 NOTE — Progress Notes (Signed)
ROB: Reports no problems.  Says that sugars are well controlled with diet.  Blood pressure good on labetalol.  BPP tomorrow at MFM.  NST next week with visit.  Cultures next week.

## 2021-04-30 ENCOUNTER — Ambulatory Visit: Payer: Medicaid Other

## 2021-04-30 ENCOUNTER — Ambulatory Visit: Payer: Medicaid Other | Admitting: *Deleted

## 2021-04-30 ENCOUNTER — Ambulatory Visit: Payer: Medicaid Other | Attending: Obstetrics

## 2021-04-30 VITALS — BP 132/78 | HR 70

## 2021-04-30 DIAGNOSIS — Z3A35 35 weeks gestation of pregnancy: Secondary | ICD-10-CM | POA: Diagnosis not present

## 2021-04-30 DIAGNOSIS — O24410 Gestational diabetes mellitus in pregnancy, diet controlled: Secondary | ICD-10-CM | POA: Diagnosis present

## 2021-04-30 DIAGNOSIS — O133 Gestational [pregnancy-induced] hypertension without significant proteinuria, third trimester: Secondary | ICD-10-CM | POA: Insufficient documentation

## 2021-04-30 DIAGNOSIS — O24419 Gestational diabetes mellitus in pregnancy, unspecified control: Secondary | ICD-10-CM | POA: Insufficient documentation

## 2021-05-05 ENCOUNTER — Other Ambulatory Visit: Payer: Medicaid Other

## 2021-05-05 ENCOUNTER — Ambulatory Visit (INDEPENDENT_AMBULATORY_CARE_PROVIDER_SITE_OTHER): Payer: Medicaid Other | Admitting: Certified Nurse Midwife

## 2021-05-05 ENCOUNTER — Other Ambulatory Visit: Payer: Self-pay

## 2021-05-05 ENCOUNTER — Other Ambulatory Visit: Payer: Self-pay | Admitting: *Deleted

## 2021-05-05 VITALS — BP 138/89 | HR 102 | Wt 152.0 lb

## 2021-05-05 DIAGNOSIS — O133 Gestational [pregnancy-induced] hypertension without significant proteinuria, third trimester: Secondary | ICD-10-CM

## 2021-05-05 DIAGNOSIS — Z113 Encounter for screening for infections with a predominantly sexual mode of transmission: Secondary | ICD-10-CM

## 2021-05-05 DIAGNOSIS — O0993 Supervision of high risk pregnancy, unspecified, third trimester: Secondary | ICD-10-CM | POA: Diagnosis not present

## 2021-05-05 DIAGNOSIS — O24410 Gestational diabetes mellitus in pregnancy, diet controlled: Secondary | ICD-10-CM

## 2021-05-05 DIAGNOSIS — Z3A36 36 weeks gestation of pregnancy: Secondary | ICD-10-CM

## 2021-05-05 LAB — POCT URINALYSIS DIPSTICK OB
Bilirubin, UA: NEGATIVE
Blood, UA: NEGATIVE
Glucose, UA: NEGATIVE
Ketones, UA: NEGATIVE
Leukocytes, UA: NEGATIVE
Nitrite, UA: NEGATIVE
POC,PROTEIN,UA: NEGATIVE
Spec Grav, UA: 1.01 (ref 1.010–1.025)
Urobilinogen, UA: 0.2 E.U./dL
pH, UA: 7.5 (ref 5.0–8.0)

## 2021-05-05 NOTE — Patient Instructions (Signed)
Labor Induction Labor induction is when steps are taken to cause a pregnant woman to begin the labor process. Most women go into labor on their own between 37 weeks and 42 weeks of pregnancy. When this does not happen, or when there is a medical need for labor to begin, steps may be taken to induce, or bring on, labor. Labor induction causes a pregnant woman's uterus to contract. It also causes the cervix to soften (ripen), open (dilate), and thin out. Usually, labor is not induced before 39 weeks of pregnancy unless there is a medical reason to do so. When is labor induction considered? Labor induction may be right for you if: Your pregnancy lasts longer than 41 to 42 weeks. Your placenta is separating from your uterus (placental abruption). You have a rupture of membranes and your labor does not begin. You have health problems, like diabetes or high blood pressure (preeclampsia) during your pregnancy. Your baby has stopped growing or does not have enough amniotic fluid. Before labor induction begins, your health care provider will consider the following factors: Your medical condition and the baby's condition. How many weeks you have been pregnant. How mature the baby's lungs are. The condition of your cervix. The position of the baby. The size of your birth canal. Tell a health care provider about: Any allergies you have. All medicines you are taking, including vitamins, herbs, eye drops, creams, and over-the-counter medicines. Any problems you or your family members have had with anesthetic medicines. Any surgeries you have had. Any blood disorders you have. Any medical conditions you have. What are the risks? Generally, this is a safe procedure. However, problems may occur, including: Failed induction. Changes in fetal heart rate, such as being too high, too low, or irregular (erratic). Infection in the mother or the baby. Increased risk of having a cesarean delivery. Breaking off  (abruption) of the placenta from the uterus. This is rare. Rupture of the uterus. This is very rare. Your baby could fail to get enough blood flow or oxygen. This can be life-threatening. When induction is needed for medical reasons, the benefits generally outweigh the risks. What happens during the procedure? During the procedure, your health care provider will use one of these methods to induce labor: Stripping the membranes. In this method, the amniotic sac tissue is gently separated from the cervix. This causes the following to happen: Your cervix stretches, which in turn causes the release of prostaglandins. Prostaglandins induce labor and cause the uterus to contract. This procedure is often done in an office visit. You will be sent home to wait for contractions to begin. Prostaglandin medicine. This medicine starts contractions and causes the cervix to dilate and ripen. This can be taken by mouth (orally) or by being inserted into the vagina (suppository). Inserting a small, thin tube (catheter) with a balloon into the vagina and then expanding the balloon with water to dilate the cervix. Breaking the water. In this method, a small instrument is used to make a small hole in the amniotic sac. This eventually causes the amniotic sac to break. Contractions should begin within a few hours. Medicine to trigger or strengthen contractions. This medicine is given through an IV that is inserted into a vein in your arm. This procedure may vary among health care providers and hospitals. Where to find more information March of [employer]: www.marchofdimes.org [employer]: www.acog.org Summary Labor induction causes a pregnant woman's uterus to contract. It also causes the cervix  to soften (ripen), open (dilate), and thin out. Labor is usually not induced before 39 weeks of pregnancy unless there is a medical reason to do so. When induction is needed for medical  reasons, the benefits generally outweigh the risks. Talk with your health care provider about which methods of labor induction are right for you. This information is not intended to replace advice given to you by your health care provider. Make sure you discuss any questions you have with your health care provider. Document Revised: 02/13/2020 Document Reviewed: 02/13/2020 Elsevier Patient Education  [employer].

## 2021-05-05 NOTE — Progress Notes (Signed)
ROB, doing well Had MFM BPP on 12/16 . A BPP performed today was 8 out of 8. Has repeat on 12/25.  BS fasting's less than 90, 2 hrr pp less than 120.  GBS and cultures today. SVE per pt request 3/70/-2  NST : reactive/Category 1 strip Baseline 120 Moderate variability Accelerations present Decelerations absent Ctx: 1-2 min.  Follow up as scheduled for induction on Wednesday.

## 2021-05-07 ENCOUNTER — Other Ambulatory Visit: Payer: Self-pay

## 2021-05-07 ENCOUNTER — Ambulatory Visit: Payer: Medicaid Other | Admitting: *Deleted

## 2021-05-07 ENCOUNTER — Ambulatory Visit: Payer: Medicaid Other | Attending: Obstetrics

## 2021-05-07 VITALS — BP 132/89 | HR 75

## 2021-05-07 DIAGNOSIS — O24410 Gestational diabetes mellitus in pregnancy, diet controlled: Secondary | ICD-10-CM

## 2021-05-07 DIAGNOSIS — Z3A36 36 weeks gestation of pregnancy: Secondary | ICD-10-CM

## 2021-05-07 DIAGNOSIS — O133 Gestational [pregnancy-induced] hypertension without significant proteinuria, third trimester: Secondary | ICD-10-CM | POA: Diagnosis present

## 2021-05-07 LAB — STREP GP B NAA: Strep Gp B NAA: POSITIVE — AB

## 2021-05-07 NOTE — Progress Notes (Signed)
Patient states she was 3cm on 12/21 and 5cm today. States she is contracting every 1-2 mins. States, "I have done this before and I don't need to go to the hospital yet."

## 2021-05-10 LAB — GC/CHLAMYDIA PROBE AMP
Chlamydia trachomatis, NAA: NEGATIVE
Neisseria Gonorrhoeae by PCR: NEGATIVE

## 2021-05-12 ENCOUNTER — Other Ambulatory Visit: Payer: Self-pay

## 2021-05-12 ENCOUNTER — Inpatient Hospital Stay: Payer: Medicaid Other | Admitting: Anesthesiology

## 2021-05-12 ENCOUNTER — Inpatient Hospital Stay
Admission: EM | Admit: 2021-05-12 | Discharge: 2021-05-14 | DRG: 807 | Disposition: A | Discharge status: Home / Self Care | Payer: Medicaid Other | Attending: Certified Nurse Midwife | Admitting: Certified Nurse Midwife

## 2021-05-12 ENCOUNTER — Encounter: Payer: Self-pay | Admitting: Certified Nurse Midwife

## 2021-05-12 ENCOUNTER — Other Ambulatory Visit: Payer: Self-pay | Admitting: Certified Nurse Midwife

## 2021-05-12 DIAGNOSIS — O24420 Gestational diabetes mellitus in childbirth, diet controlled: Secondary | ICD-10-CM | POA: Diagnosis present

## 2021-05-12 DIAGNOSIS — Z3A37 37 weeks gestation of pregnancy: Secondary | ICD-10-CM | POA: Diagnosis not present

## 2021-05-12 DIAGNOSIS — O99824 Streptococcus B carrier state complicating childbirth: Secondary | ICD-10-CM | POA: Diagnosis present

## 2021-05-12 DIAGNOSIS — O134 Gestational [pregnancy-induced] hypertension without significant proteinuria, complicating childbirth: Secondary | ICD-10-CM | POA: Diagnosis present

## 2021-05-12 DIAGNOSIS — Z20822 Contact with and (suspected) exposure to covid-19: Secondary | ICD-10-CM | POA: Diagnosis present

## 2021-05-12 LAB — CBC
HCT: 29.8 % — ABNORMAL LOW (ref 36.0–46.0)
Hemoglobin: 10 g/dL — ABNORMAL LOW (ref 12.0–15.0)
MCH: 29 pg (ref 26.0–34.0)
MCHC: 33.6 g/dL (ref 30.0–36.0)
MCV: 86.4 fL (ref 80.0–100.0)
Platelets: 321 10*3/uL (ref 150–400)
RBC: 3.45 MIL/uL — ABNORMAL LOW (ref 3.87–5.11)
RDW: 12.4 % (ref 11.5–15.5)
WBC: 12 10*3/uL — ABNORMAL HIGH (ref 4.0–10.5)
nRBC: 0 % (ref 0.0–0.2)

## 2021-05-12 LAB — TYPE AND SCREEN
ABO/RH(D): O POS
Antibody Screen: NEGATIVE

## 2021-05-12 LAB — RESP PANEL BY RT-PCR (FLU A&B, COVID) ARPGX2
Influenza A by PCR: NEGATIVE
Influenza B by PCR: NEGATIVE
SARS Coronavirus 2 by RT PCR: NEGATIVE

## 2021-05-12 LAB — COMPREHENSIVE METABOLIC PANEL
ALT: 16 U/L (ref 0–44)
AST: 23 U/L (ref 15–41)
Albumin: 3.1 g/dL — ABNORMAL LOW (ref 3.5–5.0)
Alkaline Phosphatase: 152 U/L — ABNORMAL HIGH (ref 38–126)
Anion gap: 9 (ref 5–15)
BUN: 9 mg/dL (ref 6–20)
CO2: 20 mmol/L — ABNORMAL LOW (ref 22–32)
Calcium: 8.8 mg/dL — ABNORMAL LOW (ref 8.9–10.3)
Chloride: 103 mmol/L (ref 98–111)
Creatinine, Ser: 0.5 mg/dL (ref 0.44–1.00)
GFR, Estimated: >60 mL/min (ref >60)
Glucose, Bld: 95 mg/dL (ref 70–99)
Potassium: 3.8 mmol/L (ref 3.5–5.1)
Sodium: 132 mmol/L — ABNORMAL LOW (ref 135–145)
Total Bilirubin: 0.6 mg/dL (ref 0.3–1.2)
Total Protein: 6.8 g/dL (ref 6.5–8.1)

## 2021-05-12 LAB — PROTEIN / CREATININE RATIO, URINE
Creatinine, Urine: 20 mg/dL
Total Protein, Urine: <6 mg/dL

## 2021-05-12 MED ORDER — ONDANSETRON HCL 4 MG/2ML IJ SOLN: 4.0000 mg | INTRAMUSCULAR | Status: DC | PRN

## 2021-05-12 MED ORDER — ONDANSETRON HCL 4 MG/2ML IJ SOLN: 4.0000 mg | Freq: Four times a day (QID) | INTRAMUSCULAR | Status: DC | PRN

## 2021-05-12 MED ORDER — LIDOCAINE HCL (PF) 1 % IJ SOLN: 30.0000 mL | INTRAMUSCULAR | Status: DC | PRN

## 2021-05-12 MED ORDER — TERBUTALINE SULFATE 1 MG/ML IJ SOLN: 0.2500 mg | Freq: Once | INTRAMUSCULAR | Status: DC | PRN

## 2021-05-12 MED ORDER — LABETALOL HCL 200 MG PO TABS
200.0000 mg | ORAL_TABLET | Freq: Two times a day (BID) | ORAL | Status: DC
  Administered 2021-05-12 – 2021-05-14 (×4): 200 mg via ORAL
  Filled 2021-05-12 (×5): qty 1

## 2021-05-12 MED ORDER — EPHEDRINE 5 MG/ML INJ
10.0000 mg | INTRAVENOUS | Status: DC | PRN
  Filled 2021-05-12: qty 2

## 2021-05-12 MED ORDER — FERROUS SULFATE 325 (65 FE) MG PO TABS
325.0000 mg | ORAL_TABLET | Freq: Every day | ORAL | Status: DC
  Administered 2021-05-13 – 2021-05-14 (×2): 325 mg via ORAL
  Filled 2021-05-12 (×2): qty 1

## 2021-05-12 MED ORDER — OXYTOCIN-SODIUM CHLORIDE 30-0.9 UT/500ML-% IV SOLN
1.0000 m[IU]/min | INTRAVENOUS | Status: DC
  Administered 2021-05-12: 10:00:00 4 m[IU]/min via INTRAVENOUS

## 2021-05-12 MED ORDER — ACETAMINOPHEN 325 MG PO TABS: 650.0000 mg | ORAL_TABLET | ORAL | Status: DC | PRN

## 2021-05-12 MED ORDER — LACTATED RINGERS IV SOLN: INTRAVENOUS | Status: DC

## 2021-05-12 MED ORDER — FENTANYL-BUPIVACAINE-NACL 0.5-0.125-0.9 MG/250ML-% EP SOLN
EPIDURAL | Status: AC
  Filled 2021-05-12: qty 250

## 2021-05-12 MED ORDER — OXYTOCIN BOLUS FROM INFUSION
333.0000 mL | Freq: Once | INTRAVENOUS | Status: AC
  Administered 2021-05-12: 15:00:00 333 mL via INTRAVENOUS

## 2021-05-12 MED ORDER — PHENYLEPHRINE 40 MCG/ML (10ML) SYRINGE FOR IV PUSH (FOR BLOOD PRESSURE SUPPORT)
80.0000 ug | PREFILLED_SYRINGE | INTRAVENOUS | Status: DC | PRN
  Filled 2021-05-12: qty 10

## 2021-05-12 MED ORDER — DOCUSATE SODIUM 100 MG PO CAPS
100.0000 mg | ORAL_CAPSULE | Freq: Two times a day (BID) | ORAL | Status: DC
  Administered 2021-05-13 – 2021-05-14 (×3): 100 mg via ORAL
  Filled 2021-05-12 (×3): qty 1

## 2021-05-12 MED ORDER — IBUPROFEN 600 MG PO TABS
600.0000 mg | ORAL_TABLET | Freq: Four times a day (QID) | ORAL | Status: DC
  Administered 2021-05-12 – 2021-05-13 (×2): 600 mg via ORAL
  Filled 2021-05-12 (×3): qty 1

## 2021-05-12 MED ORDER — MISOPROSTOL 200 MCG PO TABS
ORAL_TABLET | ORAL | Status: AC
  Filled 2021-05-12: qty 4

## 2021-05-12 MED ORDER — DIBUCAINE (PERIANAL) 1 % EX OINT: 1.0000 "application " | TOPICAL_OINTMENT | CUTANEOUS | Status: DC | PRN

## 2021-05-12 MED ORDER — WITCH HAZEL-GLYCERIN EX PADS: 1.0000 "application " | MEDICATED_PAD | CUTANEOUS | Status: DC | PRN

## 2021-05-12 MED ORDER — OXYCODONE-ACETAMINOPHEN 5-325 MG PO TABS: 1.0000 | ORAL_TABLET | ORAL | Status: DC | PRN

## 2021-05-12 MED ORDER — ONDANSETRON HCL 4 MG PO TABS: 4.0000 mg | ORAL_TABLET | ORAL | Status: DC | PRN

## 2021-05-12 MED ORDER — SENNOSIDES-DOCUSATE SODIUM 8.6-50 MG PO TABS
2.0000 | ORAL_TABLET | ORAL | Status: DC
  Administered 2021-05-12: 20:00:00 2 via ORAL
  Filled 2021-05-12: qty 2

## 2021-05-12 MED ORDER — LIDOCAINE HCL (PF) 1 % IJ SOLN
INTRAMUSCULAR | Status: DC | PRN
  Administered 2021-05-12: 3 mL via SUBCUTANEOUS

## 2021-05-12 MED ORDER — LIDOCAINE-EPINEPHRINE (PF) 1.5 %-1:200000 IJ SOLN
INTRAMUSCULAR | Status: DC | PRN
  Administered 2021-05-12: 3 mL via EPIDURAL

## 2021-05-12 MED ORDER — SIMETHICONE 80 MG PO CHEW: 80.0000 mg | CHEWABLE_TABLET | ORAL | Status: DC | PRN

## 2021-05-12 MED ORDER — SODIUM CHLORIDE 0.9 % IV SOLN
2.0000 g | Freq: Once | INTRAVENOUS | Status: AC
  Administered 2021-05-12: 12:00:00 2 g via INTRAVENOUS

## 2021-05-12 MED ORDER — MISOPROSTOL 200 MCG PO TABS
800.0000 ug | ORAL_TABLET | Freq: Once | ORAL | Status: AC
  Administered 2021-05-12: 15:00:00 800 ug via RECTAL

## 2021-05-12 MED ORDER — BENZOCAINE-MENTHOL 20-0.5 % EX AERO
1.0000 "application " | INHALATION_SPRAY | CUTANEOUS | Status: DC | PRN
  Administered 2021-05-13: 1 via TOPICAL
  Filled 2021-05-12: qty 56

## 2021-05-12 MED ORDER — SODIUM CHLORIDE 0.9 % IV SOLN
INTRAVENOUS | Status: AC
  Filled 2021-05-12: qty 2000

## 2021-05-12 MED ORDER — LACTATED RINGERS IV SOLN: 500.0000 mL | INTRAVENOUS | Status: DC | PRN

## 2021-05-12 MED ORDER — FENTANYL-BUPIVACAINE-NACL 0.5-0.125-0.9 MG/250ML-% EP SOLN
12.0000 mL/h | EPIDURAL | Status: DC | PRN
  Administered 2021-05-12: 14:00:00 12 mL/h via EPIDURAL

## 2021-05-12 MED ORDER — SODIUM CHLORIDE 0.9 % IV SOLN: 1.0000 g | INTRAVENOUS | Status: DC

## 2021-05-12 MED ORDER — PRENATAL MULTIVITAMIN CH
1.0000 | ORAL_TABLET | Freq: Every day | ORAL | Status: DC
  Administered 2021-05-13: 13:00:00 1 via ORAL
  Filled 2021-05-12: qty 1

## 2021-05-12 MED ORDER — OXYCODONE-ACETAMINOPHEN 5-325 MG PO TABS: 2.0000 | ORAL_TABLET | ORAL | Status: DC | PRN

## 2021-05-12 MED ORDER — BUTORPHANOL TARTRATE 1 MG/ML IJ SOLN: 1.0000 mg | INTRAMUSCULAR | Status: DC | PRN

## 2021-05-12 MED ORDER — MISOPROSTOL 50MCG HALF TABLET: 50.0000 ug | ORAL_TABLET | ORAL | Status: DC

## 2021-05-12 MED ORDER — AMMONIA AROMATIC IN INHA
RESPIRATORY_TRACT | Status: AC
  Filled 2021-05-12: qty 10

## 2021-05-12 MED ORDER — LACTATED RINGERS IV SOLN: 500.0000 mL | Freq: Once | INTRAVENOUS | Status: DC

## 2021-05-12 MED ORDER — SERTRALINE HCL 25 MG PO TABS
25.0000 mg | ORAL_TABLET | Freq: Every day | ORAL | Status: DC
  Administered 2021-05-13: 18:00:00 25 mg via ORAL
  Filled 2021-05-12 (×3): qty 1

## 2021-05-12 MED ORDER — COCONUT OIL OIL
1.0000 "application " | TOPICAL_OIL | Status: DC | PRN
  Filled 2021-05-12: qty 120

## 2021-05-12 MED ORDER — BUPIVACAINE HCL (PF) 0.25 % IJ SOLN
INTRAMUSCULAR | Status: DC | PRN
  Administered 2021-05-12: 5 mL via EPIDURAL
  Administered 2021-05-12: 2 mL via EPIDURAL
  Administered 2021-05-12: 4 mL via EPIDURAL

## 2021-05-12 MED ORDER — DIPHENHYDRAMINE HCL 50 MG/ML IJ SOLN: 12.5000 mg | INTRAMUSCULAR | Status: DC | PRN

## 2021-05-12 MED ORDER — OXYTOCIN-SODIUM CHLORIDE 30-0.9 UT/500ML-% IV SOLN
2.5000 [IU]/h | INTRAVENOUS | Status: DC
  Filled 2021-05-12: qty 1000

## 2021-05-12 NOTE — Anesthesia Procedure Notes (Signed)
Epidural Patient location during procedure: OB  Staffing Anesthesiologist: Arita Miss, MD Resident/CRNA: Jerrye Noble, CRNA Performed: resident/CRNA   Preanesthetic Checklist Completed: patient identified, IV checked, site marked, risks and benefits discussed, surgical consent, monitors and equipment checked, pre-op evaluation and timeout performed  Epidural Patient position: sitting Prep: ChloraPrep Patient monitoring: heart rate, continuous pulse ox and blood pressure Approach: midline Location: L3-L4 Injection technique: LOR saline  Needle:  Needle type: Tuohy  Needle gauge: 17 G Needle length: 9 cm and 9 Needle insertion depth: 6 cm Catheter type: closed end flexible Catheter size: 19 Gauge Catheter at skin depth: 11 cm Test dose: negative and 1.5% lidocaine with Epi 1:200 K  Assessment Events: blood not aspirated, injection not painful, no injection resistance, no paresthesia and negative IV test  Additional Notes 1 attempt Pt. Evaluated and documentation done after procedure finished. Patient identified. Risks/Benefits/Options discussed with patient including but not limited to bleeding, infection, nerve damage, paralysis, failed block, incomplete pain control, headache, blood pressure changes, nausea, vomiting, reactions to medication both or allergic, itching and postpartum back pain. Confirmed with bedside nurse the patient's most recent platelet count. Confirmed with patient that they are not currently taking any anticoagulation, have any bleeding history or any family history of bleeding disorders. Patient expressed understanding and wished to proceed. All questions were answered. Sterile technique was used throughout the entire procedure. Please see nursing notes for vital signs. Test dose was given through epidural catheter and negative prior to continuing to dose epidural or start infusion. Warning signs of high block given to the patient including shortness of  breath, tingling/numbness in hands, complete motor block, or any concerning symptoms with instructions to call for help. Patient was given instructions on fall risk and not to get out of bed. All questions and concerns addressed with instructions to call with any issues or inadequate analgesia.    Patient tolerated the insertion well without immediate complications.Reason for block:procedure for pain

## 2021-05-12 NOTE — H&P (Addendum)
History and Physical   HPI  Dawn Morris is a 30 y.o. G3P1011 at 38w2dEstimated Date of Delivery: 05/31/21 who is being admitted for induction of labor for gestational hypertension on medication and gestational diabetic diet controlled.    OB History  OB History  Gravida Para Term Preterm AB Living  '3 1 1 ' 0 1 1  SAB IAB Ectopic Multiple Live Births  1 0 0 0 1    # Outcome Date GA Lbr Len/2nd Weight Sex Delivery Anes PTL Lv  3 Current           2 SAB 2018          1 Term 01/30/15 44w0d1:52 / 01:14 3640 g F Vag-Spont EPI  LIV     Birth Comments: none noted     Name: Swander,GIRL Natayla     Apgar1: 7  Apgar5: 9    PROBLEM LIST  Pregnancy complications or risks: Patient Active Problem List   Diagnosis Date Noted   Labor and delivery, indication for care 05/12/2021   Gestational hypertension, third trimester 03/28/2021   Gestational diabetes mellitus 03/28/2021   Right lower quadrant abdominal pain affecting pregnancy in second trimester 01/21/2021   Family history of Hashimoto thyroiditis 06/11/2018   Pelvic pain 05/04/2017   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    Prenatal labs and studies: ABO, Rh: --/--/O POS (12/28 094680Antibody: NEG (12/28 0952) Rubella: 5.76 (06/03 1424) RPR: Non Reactive (10/21 1131)  HBsAg: Negative (06/03 1424)  HIV: Non Reactive (06/03 1424)  GBHOZ:YYQMGNOI/ (12/21 1409)   Past Medical History:  Diagnosis Date   Cancer (HCOretta   Gestational diabetes    Increased BMI      Past Surgical History:  Procedure Laterality Date   ACHILLES TENDON SURGERY Right 12/06/2017   Procedure: ACHILLES TENDON REPAIR;  Surgeon: MiEarnestine LeysMD;  Location: [facility] ORS;  Service: Orthopedics;  Laterality: Right;   MELANOMA EXCISION     TONSILLECTOMY  2005     Medications    Current Discharge Medication List     CONTINUE these medications which have NOT CHANGED   Details  !! blood glucose meter kit and supplies Dispense based on  patient and insurance preference. Use up to four times daily as directed. (FOR ICD-10 E10.9, E11.9). Qty: 1 each, Refills: 0    !! Blood Glucose Monitoring Suppl (ACCU-CHEK GUIDE) w/Device KIT 1 KIT BY SUBDERMAL ROUTE AS DIRECTED. CHECK BLOOD SUGARS FOR FASTING, AND TWO HOURS AFTER BREAKFAST, LUNCH AND DINNER (4 CHECKS DAILY) Qty: 1 kit, Refills: 0    docusate sodium (COLACE) 100 MG capsule Take 100 mg by mouth 2 (two) times daily.    famotidine (PEPCID) 20 MG tablet Take 1 tablet (20 mg total) by mouth 2 (two) times daily. Qty: 60 tablet, Refills: 3    glucose blood (ACCU-CHEK SMARTVIEW) test strip Use as instructed to check blood sugars Qty: 100 each, Refills: 12    labetalol (NORMODYNE) 200 MG tablet Take 1 tablet (200 mg total) by mouth 2 (two) times daily. Qty: 60 tablet, Refills: 4    ondansetron (ZOFRAN ODT) 4 MG disintegrating tablet Take 1 tablet (4 mg total) by mouth every 8 (eight) hours as needed for nausea or vomiting. Qty: 20 tablet, Refills: 0    Prenatal Vit-Fe Fumarate-FA (PRENATAL MULTIVITAMIN) TABS tablet Take 1 tablet by mouth daily at 12 noon.    sertraline (ZOLOFT) 25 MG tablet Take 1 tablet (25 mg total) by mouth daily. Qty:  30 tablet, Refills: 5     !! - Potential duplicate medications found. Please discuss with provider.       Allergies  Patient has no known allergies.  Review of Systems  Constitutional: negative Eyes: negative Ears, nose, mouth, throat, and face: negative Respiratory: negative Cardiovascular: negative Gastrointestinal: negative Genitourinary:negative Integument/breast: negative Hematologic/lymphatic: negative Musculoskeletal:negative Neurological: negative Behavioral/Psych: negative Endocrine: negative Allergic/Immunologic: negative  Physical Exam  BP (!) 120/96 (BP Location: Left Arm)    Pulse 97    Temp 97.9 F (36.6 C) (Axillary)    Resp 16    Ht '5\' 5"'  (1.651 m)    Wt 70.3 kg    LMP 08/14/2020    SpO2 100%    BMI  25.79 kg/m   Lungs:  CTA B Cardio: RRR without M/R/G Abd: Soft, gravid, NT Presentation: cephalic EXT: No C/C/ no edema DTRs: 2+ Brisk No clonus CERVIX: Dilation: 4 Effacement (%): 70 Cervical Position: Middle Station: -2 Presentation: Vertex Exam by:: Robin Searing RN AROM : Clear fluid   See Prenatal records for more detailed PE.     FHR:  Baseline: 125 bpm, Variability: Good {> 6 bpm), Accelerations: Reactive, and Decelerations: Absent  Toco: Uterine Contractions: Frequency: Every 2-3 minutes  Test Results  Results for orders placed or performed during the hospital encounter of 05/12/21 (from the past 24 hour(s))  Resp Panel by RT-PCR (Flu A&B, Covid) Nasopharyngeal Swab     Status: None   Collection Time: 05/12/21  9:39 AM   Specimen: Nasopharyngeal Swab; Nasopharyngeal(NP) swabs in vial transport medium  Result Value Ref Range   SARS Coronavirus 2 by RT PCR NEGATIVE NEGATIVE   Influenza A by PCR NEGATIVE NEGATIVE   Influenza B by PCR NEGATIVE NEGATIVE  Type and screen     Status: None   Collection Time: 05/12/21  9:52 AM  Result Value Ref Range   ABO/RH(D) O POS    Antibody Screen NEG    Sample Expiration      05/15/2021,2359 Performed at [facility], [address] [place], [place] [address]   CBC     Status: Abnormal   Collection Time: 05/12/21  9:52 AM  Result Value Ref Range   WBC 12.0 (H) 4.0 - 10.5 K/uL   RBC 3.45 (L) 3.87 - 5.11 MIL/uL   Hemoglobin 10.0 (L) 12.0 - 15.0 g/dL   HCT 29.8 (L) 36.0 - 46.0 %   MCV 86.4 80.0 - 100.0 fL   MCH 29.0 26.0 - 34.0 pg   MCHC 33.6 30.0 - 36.0 g/dL   RDW 12.4 11.5 - 15.5 %   Platelets 321 150 - 400 K/uL   nRBC 0.0 0.0 - 0.2 %  Protein / creatinine ratio, urine     Status: None   Collection Time: 05/12/21  9:52 AM  Result Value Ref Range   Creatinine, Urine 20 mg/dL   Total Protein, Urine <6 mg/dL   Protein Creatinine Ratio        0.00 - 0.15 mg/mg[Cre]  Comprehensive metabolic panel     Status:  Abnormal   Collection Time: 05/12/21  9:52 AM  Result Value Ref Range   Sodium 132 (L) 135 - 145 mmol/L   Potassium 3.8 3.5 - 5.1 mmol/L   Chloride 103 98 - 111 mmol/L   CO2 20 (L) 22 - 32 mmol/L   Glucose, Bld 95 70 - 99 mg/dL   BUN 9 6 - 20 mg/dL   Creatinine, Ser 0.50 0.44 - 1.00 mg/dL  Calcium 8.8 (L) 8.9 - 10.3 mg/dL   Total Protein 6.8 6.5 - 8.1 g/dL   Albumin 3.1 (L) 3.5 - 5.0 g/dL   AST 23 15 - 41 U/L   ALT 16 0 - 44 U/L   Alkaline Phosphatase 152 (H) 38 - 126 U/L   Total Bilirubin 0.6 0.3 - 1.2 mg/dL   GFR, Estimated >60 >60 mL/min   Anion gap 9 5 - 15   Group B Strep positive  Assessment   G3P1011 at 5w2dEstimated Date of Delivery: 05/31/21  The fetus is reassuring.   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 Admitted to L&D :   2. EFM:-- Category 1 3. Stadol or Epidural if desired.   4. Admission labs completed 5. Antibiotic for GBS  6. Dr. EAmalia Haileyaware of admission 7. Anticipate NSVD   APhilip Aspen CNM  05/12/2021 12:12 PM

## 2021-05-12 NOTE — Anesthesia Preprocedure Evaluation (Signed)
Anesthesia Evaluation  Patient identified by MRN, date of birth, ID band Patient awake    Reviewed: Allergy & Precautions, H&P , NPO status , Patient's Chart, lab work & pertinent test results  Airway Mallampati: II  TM Distance: >3 FB Neck ROM: full    Dental no notable dental hx. (+) Teeth Intact   Pulmonary    Pulmonary exam normal        Cardiovascular hypertension (gestational), On Medications Normal cardiovascular exam     Neuro/Psych PSYCHIATRIC DISORDERS Depression    GI/Hepatic Neg liver ROS, GERD  ,  Endo/Other  diabetes (diet controlled), Gestational  Renal/GU      Musculoskeletal   Abdominal   Peds  Hematology negative hematology ROS (+)   Anesthesia Other Findings   Reproductive/Obstetrics (+) Pregnancy                             Anesthesia Physical Anesthesia Plan  ASA: 2  Anesthesia Plan: Epidural   Post-op Pain Management:    Induction:   PONV Risk Score and Plan:   Airway Management Planned:   Additional Equipment:   Intra-op Plan:   Post-operative Plan:   Informed Consent: I have reviewed the patients History and Physical, chart, labs and discussed the procedure including the risks, benefits and alternatives for the proposed anesthesia with the patient or authorized representative who has indicated his/her understanding and acceptance.     Dental Advisory Given  Plan Discussed with: Anesthesiologist and CRNA  Anesthesia Plan Comments:         Anesthesia Quick Evaluation

## 2021-05-13 LAB — GLUCOSE, RANDOM: Glucose, Bld: 90 mg/dL (ref 70–99)

## 2021-05-13 LAB — CBC
HCT: 29.1 % — ABNORMAL LOW (ref 36.0–46.0)
Hemoglobin: 9.5 g/dL — ABNORMAL LOW (ref 12.0–15.0)
MCH: 28.5 pg (ref 26.0–34.0)
MCHC: 32.6 g/dL (ref 30.0–36.0)
MCV: 87.4 fL (ref 80.0–100.0)
Platelets: 308 10*3/uL (ref 150–400)
RBC: 3.33 MIL/uL — ABNORMAL LOW (ref 3.87–5.11)
RDW: 12.5 % (ref 11.5–15.5)
WBC: 12.8 10*3/uL — ABNORMAL HIGH (ref 4.0–10.5)
nRBC: 0 % (ref 0.0–0.2)

## 2021-05-13 LAB — RPR: RPR Ser Ql: NONREACTIVE

## 2021-05-13 MED ORDER — FAMOTIDINE 20 MG PO TABS
20.0000 mg | ORAL_TABLET | Freq: Two times a day (BID) | ORAL | Status: DC
  Administered 2021-05-13 – 2021-05-14 (×3): 20 mg via ORAL
  Filled 2021-05-13 (×3): qty 1

## 2021-05-13 MED ORDER — IBUPROFEN 600 MG PO TABS
600.0000 mg | ORAL_TABLET | Freq: Four times a day (QID) | ORAL | Status: DC
  Administered 2021-05-13 – 2021-05-14 (×5): 600 mg via ORAL
  Filled 2021-05-13 (×5): qty 1

## 2021-05-13 NOTE — Lactation Note (Signed)
This note was copied from a baby's chart. Lactation Consultation Note  Patient Name: Dawn Morris FVCBS'W Date: 05/13/2021 Reason for consult: Follow-up assessment;Early term 37-38.6wks Age:31 hours  Lactation follow-up. Baby began to feed better overnight, and has had documented output. Mom awake, baby sleeping upon entry. Mom states that she has been feeding at the breast and he is doing well- L breast doesn't need a nipple shield but R does. She also started pumping for additional stimulation, this morning she had 67mL of colostrum that she provided via curved tip syringe an hour ago. Mom had questions re: lactation supplements, food options, and water intake. Dale answered questions: encouraged overall healthy intake from food groups, staying hydrated and frequent feedings/milk removal to build supply.  Encouraged mom to call out today for any questions or for assistance as needed. Whiteboard updated.  Maternal Data Has patient been taught Hand Expression?: Yes Does the patient have breastfeeding experience prior to this delivery?: Yes How long did the patient breastfeed?: short period  Feeding Mother's Current Feeding Choice: Breast Milk  LATCH Score                    Lactation Tools Discussed/Used Tools: Pump;Coconut oil;Nipple [place] Fuel;Other (comment) (syringe) Nipple shield size: 20 Breast pump type: Double-Electric Breast Pump Reason for Pumping: mom's choice Pumping frequency: q 3 hrs Pumped volume: 10 mL  Interventions Interventions: Breast feeding basics reviewed;Hand express;Coconut oil;DEBP;Education  Discharge Pump: Personal  Consult Status Consult Status: Follow-up Date: 05/13/21 Follow-up type: In-patient    Lavonia Drafts 05/13/2021, 9:35 AM

## 2021-05-13 NOTE — Progress Notes (Signed)
Progress Note - Vaginal Delivery  Dawn Morris is a 30 y.o. G3P1011 now PP day 1 s/p Vaginal, Spontaneous .   Subjective:  The patient reports no complaints, up ad lib, voiding, and tolerating PO   Objective:  Vital signs in last 24 hours: Temp:  [97.7 F (36.5 C)-98.8 F (37.1 C)] 97.7 F (36.5 C) (12/29 0327) Pulse Rate:  [72-97] 81 (12/29 0327) Resp:  [16-20] 18 (12/29 0327) BP: (120-156)/(82-99) 128/89 (12/29 0327) SpO2:  [99 %-100 %] 99 % (12/29 0327) Weight:  [70.3 kg] 70.3 kg (12/28 0934)  Physical Exam:  General: alert, cooperative, appears stated age, and no distress Lochia: appropriate Uterine Fundus: firm @U -1    Data Review Recent Labs    05/12/21 0952 05/13/21 0544  HGB 10.0* 9.5*  HCT 29.8* 29.1*    Assessment/Plan: Principal Problem:   Labor and delivery, indication for care   Plan for discharge tomorrow   -- Continue routine PP care.     [name], CNM  05/13/2021 7:48 AM

## 2021-05-13 NOTE — Anesthesia Postprocedure Evaluation (Signed)
Anesthesia Post Note  Patient: Dawn Morris  Procedure(s) Performed: AN AD HOC LABOR EPIDURAL  Patient location during evaluation: Mother Baby Anesthesia Type: Epidural Level of consciousness: awake and alert Pain management: pain level controlled Vital Signs Assessment: post-procedure vital signs reviewed and stable Respiratory status: spontaneous breathing, nonlabored ventilation and respiratory function stable Cardiovascular status: stable Postop Assessment: no headache, no backache, epidural receding and able to ambulate Anesthetic complications: no   No notable events documented.   Last Vitals:  Vitals:   05/13/21 0010 05/13/21 0327  BP: 127/84 128/89  Pulse: 72 81  Resp: 20 18  Temp: 36.7 C 36.5 C  SpO2: 99% 99%    Last Pain:  Vitals:   05/13/21 0327  TempSrc: Oral  PainSc:                  Caryl Asp

## 2021-05-14 ENCOUNTER — Encounter: Payer: Self-pay | Admitting: Certified Nurse Midwife

## 2021-05-14 ENCOUNTER — Ambulatory Visit: Payer: Medicaid Other

## 2021-05-14 MED ORDER — IBUPROFEN 600 MG PO TABS: 600.0000 mg | ORAL_TABLET | Freq: Four times a day (QID) | ORAL | 0 refills | Status: DC

## 2021-05-14 MED ORDER — NORETHINDRONE 0.35 MG PO TABS: 1.0000 | ORAL_TABLET | Freq: Every day | ORAL | 11 refills | Status: DC

## 2021-05-14 NOTE — Progress Notes (Signed)
Mother discharged.  Discharge instructions given.  Mother verbalizes understanding.  Transported by auxiliary.

## 2021-05-14 NOTE — Discharge Summary (Addendum)
Patient Name: Dawn Morris DOB: June 16, 1990 MRN: [id number]                            Discharge Summary  Date of Admission: 05/12/2021 Date of Discharge: 05/14/2021 Delivering Provider: [name]   Admitting Diagnosis: Labor and delivery, indication for care [O75.9] at 54w2dSecondary diagnosis:  Principal Problem:   Labor and delivery, indication for care Gestational Diabetes diet controlled Gestational hypertension on medications  Mode of Delivery: normal spontaneous vaginal delivery              Discharge diagnosis: Term Pregnancy Delivered, Gestational Hypertension, and Gestational diabetes diet controlled       Intrapartum Procedures: Atificial rupture of membranes, epidural, and GBS prophylaxis   Post partum procedures:  none  Complications: none                     Discharge Day SOAP Note:  Progress Note - Vaginal Delivery  Dawn DEMEDEIROSis a 30y.o. G3P1011 now PP day 2 s/p Vaginal, Spontaneous . Delivery was complicated by gestational diabetes and gestational hypertension  Subjective  The patient has the following complaints: has no unusual complaints  Pain is controlled with current medications.   Patient is urinating without difficulty.  She is ambulating well.     Objective  Vital signs: BP 124/89 (BP Location: Right Arm)    Pulse 74    Temp 97.6 F (36.4 C) (Oral)    Resp 20    Ht '5\' 5"'  (1.651 m)    Wt 70.3 kg    LMP 08/14/2020    SpO2 100%    Breastfeeding Unknown    BMI 25.79 kg/m   Physical Exam: Gen: NAD Fundus Fundal Tone: Firm  Lochia Amount: Scant        Data Review Labs: Lab Results  Component Value Date   WBC 12.8 (H) 05/13/2021   HGB 9.5 (L) 05/13/2021   HCT 29.1 (L) 05/13/2021   MCV 87.4 05/13/2021   PLT 308 05/13/2021   CBC Latest Ref Rng & Units 05/13/2021 05/12/2021 03/05/2021  WBC 4.0 - 10.5 K/uL 12.8(H) 12.0(H) 8.8  Hemoglobin 12.0 - 15.0 g/dL 9.5(L) 10.0(L) 11.3  Hematocrit 36.0 - 46.0 % 29.1(L)  29.8(L) 33.9(L)  Platelets 150 - 400 K/uL 308 321 316   O POS  Edinburgh Score: Edinburgh Postnatal Depression Scale Screening Tool 05/13/2021  I have been able to laugh and see the funny side of things. 0  I have looked forward with enjoyment to things. 0  I have blamed myself unnecessarily when things went wrong. 1  I have been anxious or worried for no good reason. 1  I have felt scared or panicky for no good reason. 0  Things have been getting on top of me. 1  I have been so unhappy that I have had difficulty sleeping. 0  I have felt sad or miserable. 0  I have been so unhappy that I have been crying. 0  The thought of harming myself has occurred to me. 0  Edinburgh Postnatal Depression Scale Total 3    Assessment/Plan  Principal Problem:   Labor and delivery, indication for care    Plan for discharge today.  Discharge Instructions: Per After Visit Summary. Activity: Advance as tolerated. Pelvic rest for 6 weeks.  Also refer to After Visit Summary Diet: Regular Medications: Allergies as of 05/14/2021   No  Known Allergies      Medication List     STOP taking these medications    Accu-Chek Guide w/Device Kit   Accu-Chek SmartView test strip Generic drug: glucose blood   blood glucose meter kit and supplies       TAKE these medications    docusate sodium 100 MG capsule Commonly known as: COLACE Take 100 mg by mouth 2 (two) times daily.   famotidine 20 MG tablet Commonly known as: Pepcid Take 1 tablet (20 mg total) by mouth 2 (two) times daily.   ibuprofen 600 MG tablet Commonly known as: ADVIL Take 1 tablet (600 mg total) by mouth every 6 (six) hours.   labetalol 200 MG tablet Commonly known as: NORMODYNE Take 1 tablet (200 mg total) by mouth 2 (two) times daily.   norethindrone 0.35 MG tablet Commonly known as: MICRONOR Take 1 tablet (0.35 mg total) by mouth daily.   ondansetron 4 MG disintegrating tablet Commonly known as: Zofran ODT Take  1 tablet (4 mg total) by mouth every 8 (eight) hours as needed for nausea or vomiting.   prenatal multivitamin Tabs tablet Take 1 tablet by mouth daily at 12 noon.   sertraline 25 MG tablet Commonly known as: Zoloft Take 1 tablet (25 mg total) by mouth daily.       Outpatient follow up: 2 week video visit & 6 week postpartum visit with [name], CNM  Postpartum contraception: progestin only pill at 4 wks postpartum   Discharged Condition: good  Discharged to: home  Newborn Data: Disposition:home with mother  Apgars: APGAR (1 MIN): 6   APGAR (5 MINS): 8   APGAR (10 MINS): 9    Baby Feeding: Breast    [name], CNM  05/14/2021 9:46 AM

## 2021-05-14 NOTE — Final Progress Note (Addendum)
Discharge Day SOAP Note:  Progress Note - Vaginal Delivery  Dawn Morris is a 30 y.o. G3P1011 now PP day 2 s/p Vaginal, Spontaneous . Delivery was complicated by gestational diabetes and gestational hypertension  Subjective  The patient has the following complaints: has no unusual complaints  Pain is controlled with current medications.   Patient is urinating without difficulty.  She is ambulating well.     Objective  Vital signs: BP 124/89 (BP Location: Right Arm)    Pulse 74    Temp 97.6 F (36.4 C) (Oral)    Resp 20    Ht '5\' 5"'  (1.651 m)    Wt 70.3 kg    LMP 08/14/2020    SpO2 100%    Breastfeeding Unknown    BMI 25.79 kg/m   Physical Exam: Gen: NAD Fundus Fundal Tone: Firm  Lochia Amount: Scant        Data Review Labs: Lab Results  Component Value Date   WBC 12.8 (H) 05/13/2021   HGB 9.5 (L) 05/13/2021   HCT 29.1 (L) 05/13/2021   MCV 87.4 05/13/2021   PLT 308 05/13/2021   CBC Latest Ref Rng & Units 05/13/2021 05/12/2021 03/05/2021  WBC 4.0 - 10.5 K/uL 12.8(H) 12.0(H) 8.8  Hemoglobin 12.0 - 15.0 g/dL 9.5(L) 10.0(L) 11.3  Hematocrit 36.0 - 46.0 % 29.1(L) 29.8(L) 33.9(L)  Platelets 150 - 400 K/uL 308 321 316   O POS  Edinburgh Score: Edinburgh Postnatal Depression Scale Screening Tool 05/13/2021  I have been able to laugh and see the funny side of things. 0  I have looked forward with enjoyment to things. 0  I have blamed myself unnecessarily when things went wrong. 1  I have been anxious or worried for no good reason. 1  I have felt scared or panicky for no good reason. 0  Things have been getting on top of me. 1  I have been so unhappy that I have had difficulty sleeping. 0  I have felt sad or miserable. 0  I have been so unhappy that I have been crying. 0  The thought of harming myself has occurred to me. 0  Edinburgh Postnatal Depression Scale Total 3    Assessment/Plan  Principal Problem:   Labor and delivery, indication for care    Plan  for discharge today.  Discharge Instructions: Per After Visit Summary. Activity: Advance as tolerated. Pelvic rest for 6 weeks.  Also refer to After Visit Summary Diet: Regular Medications: Allergies as of 05/14/2021   No Known Allergies      Medication List     STOP taking these medications    Accu-Chek Guide w/Device Kit   Accu-Chek SmartView test strip Generic drug: glucose blood   blood glucose meter kit and supplies       TAKE these medications    docusate sodium 100 MG capsule Commonly known as: COLACE Take 100 mg by mouth 2 (two) times daily.   famotidine 20 MG tablet Commonly known as: Pepcid Take 1 tablet (20 mg total) by mouth 2 (two) times daily.   ibuprofen 600 MG tablet Commonly known as: ADVIL Take 1 tablet (600 mg total) by mouth every 6 (six) hours.   labetalol 200 MG tablet Commonly known as: NORMODYNE Take 1 tablet (200 mg total) by mouth 2 (two) times daily.   ondansetron 4 MG disintegrating tablet Commonly known as: Zofran ODT Take 1 tablet (4 mg total) by mouth every 8 (eight) hours as needed for nausea or vomiting.  prenatal multivitamin Tabs tablet Take 1 tablet by mouth daily at 12 noon.   sertraline 25 MG tablet Commonly known as: Zoloft Take 1 tablet (25 mg total) by mouth daily.       Outpatient follow up: 2 week video visit & 6 week postpartum visit with [name], CNM  Postpartum contraception: POP at 4 wk postpartum   Discharged Condition: good  Discharged to: home  Newborn Data: Disposition:home with mother  Apgars: APGAR (1 MIN): 6   APGAR (5 MINS): 8   APGAR (10 MINS): 9    Baby Feeding: Breast    [name], CNM  05/14/2021 9:38 AM

## 2021-05-24 ENCOUNTER — Encounter: Payer: Self-pay | Admitting: Certified Nurse Midwife

## 2021-05-26 ENCOUNTER — Telehealth (INDEPENDENT_AMBULATORY_CARE_PROVIDER_SITE_OTHER): Payer: Medicaid Other | Admitting: Certified Nurse Midwife

## 2021-05-26 VITALS — Ht 65.0 in

## 2021-05-26 DIAGNOSIS — Z1331 Encounter for screening for depression: Secondary | ICD-10-CM

## 2021-05-26 DIAGNOSIS — Z1332 Encounter for screening for maternal depression: Secondary | ICD-10-CM | POA: Diagnosis not present

## 2021-05-26 NOTE — Progress Notes (Signed)
Virtual Visit via Video Note  I connected with Dawn Morris on 05/26/21 at 10:45 AM EST by a video enabled telemedicine application and verified that I am speaking with the correct person using two identifiers.  Location: Patient: at home Provider: at office    I discussed the limitations of evaluation and management by telemedicine and the availability of in person appointments. The patient expressed understanding and agreed to proceed.  History of Present Illness: G3P2012 2 wks post SVD , induction for gestational hypertension and gestational diabetes diet controlled.    Observations/Objective: Doing well, has family and friends that are helping. States she is a little over whelmed being an only parent but her family is helping her. She states her bleeding is slowing and denies pain . She is mostly pumping which is going well.   Assessment and Plan: EDPS10 pt on zoloft currently feels like she is doing well at this time.   Follow Up Instructions: 4 wks as scheduled for 6 week postpartum visit    I discussed the assessment and treatment plan with the patient. The patient was provided an opportunity to ask questions and all were answered. The patient agreed with the plan and demonstrated an understanding of the instructions.   The patient was advised to call back or seek an in-person evaluation if the symptoms worsen or if the condition fails to improve as anticipated.  I provided 7 minutes of non-face-to-face time during this encounter.   [name], CNM

## 2021-06-21 ENCOUNTER — Other Ambulatory Visit: Payer: Self-pay

## 2021-06-21 ENCOUNTER — Encounter: Payer: Self-pay | Admitting: Certified Nurse Midwife

## 2021-06-21 DIAGNOSIS — Z131 Encounter for screening for diabetes mellitus: Secondary | ICD-10-CM

## 2021-06-21 NOTE — Telephone Encounter (Signed)
LVM  forms left in bin to be picked up after fee is paid.

## 2021-06-23 ENCOUNTER — Other Ambulatory Visit: Payer: Medicaid Other

## 2021-06-23 ENCOUNTER — Encounter: Payer: Medicaid Other | Admitting: Certified Nurse Midwife

## 2021-07-05 ENCOUNTER — Encounter: Payer: Medicaid Other | Admitting: Certified Nurse Midwife

## 2021-07-15 ENCOUNTER — Other Ambulatory Visit: Payer: Self-pay | Admitting: Certified Nurse Midwife

## 2021-07-15 ENCOUNTER — Other Ambulatory Visit (INDEPENDENT_AMBULATORY_CARE_PROVIDER_SITE_OTHER): Payer: Self-pay | Admitting: Certified Nurse Midwife

## 2021-07-15 ENCOUNTER — Encounter: Payer: Self-pay | Admitting: Certified Nurse Midwife

## 2021-07-16 MED ORDER — ONDANSETRON 4 MG PO TBDP: 4.0000 mg | ORAL_TABLET | Freq: Three times a day (TID) | ORAL | 0 refills | Status: DC | PRN

## 2021-07-26 ENCOUNTER — Other Ambulatory Visit: Payer: Self-pay | Admitting: Certified Nurse Midwife

## 2021-07-26 MED ORDER — IBUPROFEN 600 MG PO TABS: 600.0000 mg | ORAL_TABLET | Freq: Four times a day (QID) | ORAL | 0 refills | Status: DC

## 2021-07-27 ENCOUNTER — Encounter: Payer: Self-pay | Admitting: Certified Nurse Midwife

## 2021-07-30 LAB — FETAL NONSTRESS TEST

## 2021-08-02 ENCOUNTER — Other Ambulatory Visit: Payer: Self-pay | Admitting: Certified Nurse Midwife

## 2021-08-03 ENCOUNTER — Encounter: Payer: Self-pay | Admitting: Certified Nurse Midwife

## 2021-08-03 MED ORDER — ONDANSETRON 4 MG PO TBDP: 4.0000 mg | ORAL_TABLET | Freq: Three times a day (TID) | ORAL | 0 refills | Status: DC | PRN

## 2021-08-04 ENCOUNTER — Telehealth (INDEPENDENT_AMBULATORY_CARE_PROVIDER_SITE_OTHER): Payer: Medicaid Other | Admitting: Certified Nurse Midwife

## 2021-08-04 ENCOUNTER — Encounter: Payer: Self-pay | Admitting: Certified Nurse Midwife

## 2021-08-04 VITALS — Ht 65.0 in

## 2021-08-04 DIAGNOSIS — F419 Anxiety disorder, unspecified: Secondary | ICD-10-CM | POA: Diagnosis not present

## 2021-08-04 MED ORDER — ONDANSETRON 4 MG PO TBDP: 4.0000 mg | ORAL_TABLET | Freq: Three times a day (TID) | ORAL | 0 refills | Status: DC | PRN

## 2021-08-04 MED ORDER — SERTRALINE HCL 50 MG PO TABS: 50.0000 mg | ORAL_TABLET | Freq: Every day | ORAL | 1 refills | Status: DC
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
## 2021-10-09 ENCOUNTER — Other Ambulatory Visit: Payer: Self-pay | Admitting: Certified Nurse Midwife

## 2021-10-12 MED ORDER — IBUPROFEN 600 MG PO TABS: 600.0000 mg | ORAL_TABLET | Freq: Four times a day (QID) | ORAL | 0 refills | Status: DC

## 2021-10-12 MED ORDER — ONDANSETRON 4 MG PO TBDP: 4.0000 mg | ORAL_TABLET | Freq: Three times a day (TID) | ORAL | 0 refills | Status: DC | PRN

## 2021-10-21 ENCOUNTER — Encounter: Payer: Self-pay | Admitting: Certified Nurse Midwife

## 2021-10-22 ENCOUNTER — Other Ambulatory Visit: Payer: Self-pay | Admitting: Certified Nurse Midwife

## 2021-10-22 MED ORDER — LABETALOL HCL 200 MG PO TABS: 200.0000 mg | ORAL_TABLET | Freq: Two times a day (BID) | ORAL | 1 refills | Status: DC

## 2021-10-22 MED ORDER — FAMOTIDINE 20 MG PO TABS: 20.0000 mg | ORAL_TABLET | Freq: Two times a day (BID) | ORAL | 3 refills | Status: DC

## 2021-10-22 MED ORDER — SERTRALINE HCL 50 MG PO TABS: 50.0000 mg | ORAL_TABLET | Freq: Every day | ORAL | 3 refills | Status: DC

## 2021-12-15 ENCOUNTER — Encounter: Payer: Self-pay | Admitting: Certified Nurse Midwife

## 2021-12-15 ENCOUNTER — Ambulatory Visit (INDEPENDENT_AMBULATORY_CARE_PROVIDER_SITE_OTHER): Payer: Medicaid Other | Admitting: Certified Nurse Midwife

## 2021-12-15 VITALS — BP 127/79 | HR 78 | Ht 65.0 in | Wt 134.3 lb

## 2021-12-15 DIAGNOSIS — Z79899 Other long term (current) drug therapy: Secondary | ICD-10-CM | POA: Diagnosis not present

## 2021-12-15 MED ORDER — SERTRALINE HCL 50 MG PO TABS: 50.0000 mg | ORAL_TABLET | Freq: Every day | ORAL | 11 refills | Status: DC

## 2021-12-15 NOTE — Progress Notes (Signed)
Medication Management Clinic Visit Note  Patient: Dawn Morris MRN: [id number] Date of Birth: Apr 19, 1991 PCP: Patient, No Pcp Per   Dawn Morris 31 y.o. female presents for medication refill visit today. Pt state she has been out of her Zoloft and labetalol for 1.5 months. Her BP today is normal. Discussed that hypertension no longer present and she does not need to continue this. She notes that since being out of her Zoloft she has had more episodes of panic attacks .    BP 127/79   Pulse 78   Ht '5\' 5"'$  (1.651 m)   Wt 134 lb 4.8 oz (60.9 kg)   LMP  (LMP Unknown)   Breastfeeding Yes   BMI 22.35 kg/m   Patient Information   Past Medical History:  Diagnosis Date   Cancer ([place])    Gestational diabetes    Increased BMI       Past Surgical History:  Procedure Laterality Date   ACHILLES TENDON SURGERY Right 12/06/2017   Procedure: ACHILLES TENDON REPAIR;  Surgeon: Earnestine Leys, MD;  Location: [facility] ORS;  Service: Orthopedics;  Laterality: Right;   MELANOMA EXCISION     TONSILLECTOMY  2005     Family History  Problem Relation Age of Onset   Healthy Mother    Healthy Father    Healthy Sister    Healthy Maternal Aunt    Healthy Maternal Uncle    Healthy Paternal Aunt    Healthy Paternal Uncle    Healthy Maternal Grandmother    Cancer Neg Hx    Diabetes Neg Hx    Heart disease Neg Hx    Breast cancer Neg Hx    Ovarian cancer Neg Hx    Colon cancer Neg Hx     New Diagnoses (since last visit):    Social History   Substance and Sexual Activity  Alcohol Use No      Social History   Tobacco Use  Smoking Status Never  Smokeless Tobacco Never      Health Maintenance  Topic Date Due   COVID-19 Vaccine (1) Never done   URINE MICROALBUMIN  Never done   INFLUENZA VACCINE  12/14/2021   PAP SMEAR-Modifier  10/24/2023   TETANUS/TDAP  03/06/2031   Hepatitis C Screening  Completed   HIV Screening  Completed   HPV VACCINES  Aged Out        Assessment and Plan:    12/15/2021   10:27 AM 08/04/2021    4:07 PM 03/19/2021   11:15 AM 03/05/2021   11:12 AM  GAD 7 : Generalized Anxiety Score  Nervous, Anxious, on Edge '2 2 2 2  '$ Control/stop worrying '2 2 1 3  '$ Worry too much - different things '1 3 3 2  '$ Trouble relaxing '2 3 2 2  '$ Restless '1 2 1 2  '$ Easily annoyed or irritable '2 2 2 2  '$ Afraid - awful might happen 0 3 0 2  Total GAD 7 Score '10 17 11 15  '$ Anxiety Difficulty Very difficult Somewhat difficult         12/15/2021   10:27 AM 03/19/2021   11:15 AM 03/10/2021    9:20 AM  PHQ9 SCORE ONLY  PHQ-9 Total Score '13 10 1    '$ Over all is doing better but feels like since being off of medication her symptoms are returning and are more frequent. Notes that the dose she was on was good for her and she felt more like herself. Orders placed Zoloft  50 mg po q day. Follow up prn or for annual exam.   [name], CNM

## 2021-12-31 ENCOUNTER — Encounter: Payer: Self-pay | Admitting: Certified Nurse Midwife

## 2022-01-04 ENCOUNTER — Encounter: Payer: Self-pay | Admitting: Certified Nurse Midwife

## 2022-01-04 ENCOUNTER — Other Ambulatory Visit: Payer: Self-pay | Admitting: Certified Nurse Midwife

## 2022-01-04 DIAGNOSIS — R110 Nausea: Secondary | ICD-10-CM

## 2022-01-04 DIAGNOSIS — F419 Anxiety disorder, unspecified: Secondary | ICD-10-CM
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
## 2022-02-21 ENCOUNTER — Telehealth: Payer: Self-pay | Admitting: Psychiatry

## 2022-02-21 ENCOUNTER — Other Ambulatory Visit
Admission: RE | Admit: 2022-02-21 | Discharge: 2022-02-21 | Disposition: A | Discharge status: Home / Self Care | Payer: Medicaid Other | Attending: Psychiatry | Admitting: Psychiatry

## 2022-02-21 ENCOUNTER — Encounter: Payer: Self-pay | Admitting: Psychiatry

## 2022-02-21 ENCOUNTER — Ambulatory Visit (INDEPENDENT_AMBULATORY_CARE_PROVIDER_SITE_OTHER): Payer: Medicaid Other | Admitting: Psychiatry

## 2022-02-21 VITALS — BP 143/94 | HR 64 | Temp 98.3°F | Ht 66.0 in | Wt 145.4 lb

## 2022-02-21 DIAGNOSIS — F321 Major depressive disorder, single episode, moderate: Secondary | ICD-10-CM | POA: Insufficient documentation

## 2022-02-21 DIAGNOSIS — F4310 Post-traumatic stress disorder, unspecified: Secondary | ICD-10-CM

## 2022-02-21 LAB — CBC
HCT: 33.8 % — ABNORMAL LOW (ref 36.0–46.0)
Hemoglobin: 10.9 g/dL — ABNORMAL LOW (ref 12.0–15.0)
MCH: 29.1 pg (ref 26.0–34.0)
MCHC: 32.2 g/dL (ref 30.0–36.0)
MCV: 90.1 fL (ref 80.0–100.0)
Platelets: 257 10*3/uL (ref 150–400)
RBC: 3.75 MIL/uL — ABNORMAL LOW (ref 3.87–5.11)
RDW: 14.6 % (ref 11.5–15.5)
WBC: 6.6 10*3/uL (ref 4.0–10.5)
nRBC: 0 % (ref 0.0–0.2)

## 2022-02-21 LAB — TSH: TSH: 2.106 u[IU]/mL (ref 0.350–4.500)

## 2022-02-21 MED ORDER — HYDROXYZINE HCL 25 MG PO TABS: 25.0000 mg | ORAL_TABLET | Freq: Every day | ORAL | 1 refills | Status: DC | PRN

## 2022-02-21 MED ORDER — SERTRALINE HCL 100 MG PO TABS: 100.0000 mg | ORAL_TABLET | Freq: Every day | ORAL | 1 refills | Status: DC

## 2022-02-21 NOTE — Telephone Encounter (Signed)
Called regarding the blood test result. She was not available on the phone. No option to leave a voice message. The result/letter was sent in [place].

## 2022-02-21 NOTE — Patient Instructions (Signed)
Increase sertraline 100 mg at night  Start hydroxyzine 25 mg daily as needed for anxiety  Referral to therapy; [facility] Obtain labs (CBC, TSH) Next appointment: 12/4 at 8 AM

## 2022-02-22 ENCOUNTER — Encounter: Payer: Self-pay | Admitting: Certified Nurse Midwife

## 2022-03-04 ENCOUNTER — Encounter: Payer: Self-pay | Admitting: Certified Nurse Midwife
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
## 2022-04-18 ENCOUNTER — Ambulatory Visit: Payer: Medicaid Other | Admitting: Psychiatry

## 2022-07-12 ENCOUNTER — Encounter: Payer: Self-pay | Admitting: Certified Nurse Midwife

## 2022-07-13 NOTE — Telephone Encounter (Signed)
Please schedule

## 2022-07-14 ENCOUNTER — Telehealth: Payer: Self-pay | Admitting: Psychiatry

## 2022-07-14 ENCOUNTER — Other Ambulatory Visit: Payer: Self-pay | Admitting: Certified Nurse Midwife

## 2022-07-14 DIAGNOSIS — R102 Pelvic and perineal pain unspecified side: Secondary | ICD-10-CM

## 2022-07-14 NOTE — Telephone Encounter (Signed)
Patient missed appointment in December 2023 due to her and son being in hospital most of month. Calling now to schedule, 09-15-22 is new appointment. She states the Zoloft 50 mg prn is too strong. She is way too tired when she takes this. Nothing has changed with her court status and person has not been arrested at this time. So her anxiety is continued to be high. Has appointment at [facility] for counseling on 07-15-22.Please advise regarding the medication

## 2022-07-14 NOTE — Telephone Encounter (Signed)
Could you please verify whether she is currently taking 50 mg or 100 mg, as discussed at the last visit? If she is taking 50 mg, please advise her to take half, which is 25 mg. If she is on 100 mg, advise her to continue taking 50 mg until the next visit.

## 2022-07-15 DIAGNOSIS — F4310 Post-traumatic stress disorder, unspecified: Secondary | ICD-10-CM | POA: Diagnosis not present

## 2022-07-15 NOTE — Telephone Encounter (Signed)
Called to discuss message from Dr Modesta Messing with patient no answer left voicemail for patient to return call to the office.

## 2022-07-25 ENCOUNTER — Ambulatory Visit
Admission: RE | Admit: 2022-07-25 | Discharge: 2022-07-25 | Disposition: A | Discharge status: Home / Self Care | Payer: Medicaid Other | Source: Ambulatory Visit | Attending: Certified Nurse Midwife | Admitting: Certified Nurse Midwife

## 2022-07-25 ENCOUNTER — Encounter: Payer: Self-pay | Admitting: Certified Nurse Midwife

## 2022-07-25 DIAGNOSIS — R102 Pelvic and perineal pain: Secondary | ICD-10-CM | POA: Diagnosis not present

## 2022-07-27 ENCOUNTER — Telehealth: Payer: Self-pay

## 2022-07-27 ENCOUNTER — Ambulatory Visit (INDEPENDENT_AMBULATORY_CARE_PROVIDER_SITE_OTHER): Payer: Medicaid Other | Admitting: Certified Nurse Midwife

## 2022-07-27 DIAGNOSIS — R102 Pelvic and perineal pain: Secondary | ICD-10-CM

## 2022-07-27 NOTE — Telephone Encounter (Signed)
Patient was scheduled for a GYN visit with annie today at 10:55 for pelvic pain and missed visit, can we contact patient and see if she would like to reschedule appt? KW

## 2022-07-27 NOTE — Progress Notes (Signed)
Pt left prior to being seen due to another engagement.   NO charge visit. Pt not seen   [name], CNM

## 2022-07-27 NOTE — Telephone Encounter (Signed)
I contacted patient via phone. I left voicemail for patient to call back to be scheduled.   

## 2022-07-28 ENCOUNTER — Encounter: Payer: Self-pay | Admitting: Obstetrics and Gynecology

## 2022-07-28 NOTE — Telephone Encounter (Signed)
I contacted patient via phone. I left voicemail for patient to call back to be scheduled.  Sending missed letter appointment.

## 2022-08-20 ENCOUNTER — Other Ambulatory Visit: Payer: Self-pay | Admitting: Psychiatry

## 2022-08-31 ENCOUNTER — Other Ambulatory Visit: Payer: Self-pay | Admitting: Psychiatry
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
## 2022-09-15 ENCOUNTER — Ambulatory Visit: Payer: Medicaid Other | Admitting: Psychiatry

## 2022-09-17 ENCOUNTER — Other Ambulatory Visit: Payer: Self-pay | Admitting: Psychiatry

## 2022-10-12 ENCOUNTER — Emergency Department: Payer: Medicaid Other

## 2022-10-12 ENCOUNTER — Emergency Department
Admission: EM | Admit: 2022-10-12 | Discharge: 2022-10-12 | Disposition: A | Discharge status: Home / Self Care | Payer: Medicaid Other | Attending: Student in an Organized Health Care Education/Training Program | Admitting: Student in an Organized Health Care Education/Training Program

## 2022-10-12 ENCOUNTER — Encounter: Payer: Self-pay | Admitting: Emergency Medicine

## 2022-10-12 ENCOUNTER — Other Ambulatory Visit: Payer: Self-pay

## 2022-10-12 DIAGNOSIS — M25562 Pain in left knee: Secondary | ICD-10-CM | POA: Insufficient documentation

## 2022-10-12 DIAGNOSIS — R1031 Right lower quadrant pain: Secondary | ICD-10-CM | POA: Diagnosis not present

## 2022-10-12 DIAGNOSIS — R112 Nausea with vomiting, unspecified: Secondary | ICD-10-CM | POA: Diagnosis not present

## 2022-10-12 DIAGNOSIS — R197 Diarrhea, unspecified: Secondary | ICD-10-CM | POA: Insufficient documentation

## 2022-10-12 DIAGNOSIS — R109 Unspecified abdominal pain: Secondary | ICD-10-CM | POA: Diagnosis not present

## 2022-10-12 LAB — COMPREHENSIVE METABOLIC PANEL
ALT: 26 U/L (ref 0–44)
AST: 22 U/L (ref 15–41)
Albumin: 4.2 g/dL (ref 3.5–5.0)
Alkaline Phosphatase: 66 U/L (ref 38–126)
Anion gap: 7 (ref 5–15)
BUN: 8 mg/dL (ref 6–20)
CO2: 26 mmol/L (ref 22–32)
Calcium: 8.6 mg/dL — ABNORMAL LOW (ref 8.9–10.3)
Chloride: 104 mmol/L (ref 98–111)
Creatinine, Ser: 0.59 mg/dL (ref 0.44–1.00)
GFR, Estimated: >60 mL/min (ref >60)
Glucose, Bld: 113 mg/dL — ABNORMAL HIGH (ref 70–99)
Potassium: 3.1 mmol/L — ABNORMAL LOW (ref 3.5–5.1)
Sodium: 137 mmol/L (ref 135–145)
Total Bilirubin: 0.4 mg/dL (ref 0.3–1.2)
Total Protein: 7 g/dL (ref 6.5–8.1)

## 2022-10-12 LAB — CBC
HCT: 36.5 % (ref 36.0–46.0)
Hemoglobin: 12.1 g/dL (ref 12.0–15.0)
MCH: 29.2 pg (ref 26.0–34.0)
MCHC: 33.2 g/dL (ref 30.0–36.0)
MCV: 88.2 fL (ref 80.0–100.0)
Platelets: 267 10*3/uL (ref 150–400)
RBC: 4.14 MIL/uL (ref 3.87–5.11)
RDW: 13.3 % (ref 11.5–15.5)
WBC: 5.8 10*3/uL (ref 4.0–10.5)
nRBC: 0 % (ref 0.0–0.2)

## 2022-10-12 LAB — URINALYSIS, ROUTINE W REFLEX MICROSCOPIC
Bilirubin Urine: NEGATIVE
Glucose, UA: NEGATIVE mg/dL
Hgb urine dipstick: NEGATIVE
Ketones, ur: NEGATIVE mg/dL
Leukocytes,Ua: NEGATIVE
Nitrite: NEGATIVE
Protein, ur: NEGATIVE mg/dL
Specific Gravity, Urine: 1.017 (ref 1.005–1.030)
pH: 6 (ref 5.0–8.0)

## 2022-10-12 LAB — POC URINE PREG, ED: Preg Test, Ur: NEGATIVE

## 2022-10-12 LAB — LIPASE, BLOOD: Lipase: 35 U/L (ref 11–51)

## 2022-10-12 MED ORDER — ONDANSETRON 4 MG PO TBDP: 4.0000 mg | ORAL_TABLET | Freq: Three times a day (TID) | ORAL | 0 refills | Status: DC | PRN

## 2022-10-12 MED ORDER — DOXYCYCLINE HYCLATE 100 MG PO TABS: 100.0000 mg | ORAL_TABLET | Freq: Two times a day (BID) | ORAL | 0 refills | Status: AC

## 2022-10-12 MED ORDER — SODIUM CHLORIDE 0.9 % IV BOLUS
1000.0000 mL | Freq: Once | INTRAVENOUS | Status: AC
  Administered 2022-10-12: 1000 mL via INTRAVENOUS

## 2022-10-12 MED ORDER — ALBUTEROL SULFATE HFA 108 (90 BASE) MCG/ACT IN AERS: 2.0000 | INHALATION_SPRAY | Freq: Four times a day (QID) | RESPIRATORY_TRACT | 2 refills | Status: DC | PRN

## 2022-10-12 MED ORDER — ONDANSETRON HCL 4 MG/2ML IJ SOLN
4.0000 mg | Freq: Once | INTRAMUSCULAR | Status: AC
  Administered 2022-10-12: 4 mg via INTRAVENOUS
  Filled 2022-10-12: qty 2

## 2022-10-12 MED ORDER — MORPHINE SULFATE (PF) 4 MG/ML IV SOLN
4.0000 mg | INTRAVENOUS | Status: DC | PRN
  Administered 2022-10-12: 4 mg via INTRAVENOUS
  Filled 2022-10-12: qty 1

## 2022-10-12 MED ORDER — IOHEXOL 300 MG/ML  SOLN
100.0000 mL | Freq: Once | INTRAMUSCULAR | Status: AC | PRN
  Administered 2022-10-12: 100 mL via INTRAVENOUS

## 2022-10-12 NOTE — ED Triage Notes (Signed)
Pt via POV from home. Pt here with multiple complaints. States that she is here for L knee pain after she tripped going up the steps. Pt also c/o RLQ abd pain, diarrhea, chills, low grade fever, and nausea. States she believes it may be her appendix. Pt is A&Ox4 and NAD

## 2022-10-12 NOTE — ED Provider Notes (Signed)
[facility] Provider Note    Event Date/Time   First MD Initiated Contact with Patient 10/12/22 1104     (approximate)   History   Abdominal Pain   HPI  Dawn Morris is a 32 y.o. female with a history of previous knee injury and ACL repair presents to the ER for evaluation of chief complaint of right lower quadrant pain that started in the periumbilical area associate with nausea vomiting diarrhea fever and chills over the past several days now having worsening right lower quadrant pain and worried that she might have appendicitis.  Also complaining of left knee pain that occurred after she had a mechanical fall onto her left knee over a week ago.  Has had a little swelling in that area.  Has been able to bear weight but feels like the pain is getting worse.     Physical Exam   Triage Vital Signs: ED Triage Vitals  Enc Vitals Group     BP 10/12/22 1036 (!) 155/108     Pulse Rate 10/12/22 1036 63     Resp 10/12/22 1036 18     Temp 10/12/22 1036 98 F (36.7 C)     Temp src --      SpO2 10/12/22 1036 98 %     Weight 10/12/22 1034 147 lb (66.7 kg)     Height 10/12/22 1034 5\' 5"  (1.651 m)     Head Circumference --      Peak Flow --      Pain Score 10/12/22 1034 9     Pain Loc --      Pain Edu? --      Excl. in GC? --     Most recent vital signs: Vitals:   10/12/22 1036  BP: (!) 155/108  Pulse: 63  Resp: 18  Temp: 98 F (36.7 C)  SpO2: 98%     Constitutional: Alert  Eyes: Conjunctivae are normal.  Head: Atraumatic. Nose: No congestion/rhinnorhea. Mouth/Throat: Mucous membranes are moist.   Neck: Painless ROM.  Cardiovascular:   Good peripheral circulation. Respiratory: Normal respiratory effort.  No retractions.  Gastrointestinal: Tender to palpation in the right lower quadrant.  No guarding or rebound. Musculoskeletal: Trace effusion of the left knee.  No contusion no deformity.  Able to hold leg up against gravity and  straight leg raise.  No instability noted Neurologic:  MAE spontaneously. No gross focal neurologic deficits are appreciated.  Skin:  Skin is warm, dry and intact. No rash noted. Psychiatric: Mood and affect are normal. Speech and behavior are normal.    ED Results / Procedures / Treatments   Labs (all labs ordered are listed, but only abnormal results are displayed) Labs Reviewed  COMPREHENSIVE METABOLIC PANEL - Abnormal; Notable for the following components:      Result Value   Potassium 3.1 (*)    Glucose, Bld 113 (*)    Calcium 8.6 (*)    All other components within normal limits  URINALYSIS, ROUTINE W REFLEX MICROSCOPIC - Abnormal; Notable for the following components:   Color, Urine YELLOW (*)    APPearance HAZY (*)    All other components within normal limits  LIPASE, BLOOD  CBC  POC URINE PREG, ED     EKG     RADIOLOGY Please see ED Course for my review and interpretation.  I personally reviewed all radiographic images ordered to evaluate for the above acute complaints and reviewed radiology reports and findings.  These  findings were personally discussed with the patient.  Please see medical record for radiology report.    PROCEDURES:  Critical Care performed: No  Procedures   MEDICATIONS ORDERED IN ED: Medications  morphine (PF) 4 MG/ML injection 4 mg (4 mg Intravenous Given 10/12/22 1148)  sodium chloride 0.9 % bolus 1,000 mL (1,000 mLs Intravenous New Bag/Given 10/12/22 1149)  ondansetron (ZOFRAN) injection 4 mg (4 mg Intravenous Given 10/12/22 1148)  iohexol (OMNIPAQUE) 300 MG/ML solution 100 mL (100 mLs Intravenous Contrast Given 10/12/22 1210)     IMPRESSION / MDM / ASSESSMENT AND PLAN / ED COURSE  I reviewed the triage vital signs and the nursing notes.                              Differential diagnosis includes, but is not limited to, appendicitis, colitis, diverticulitis, dehydration, UTI, stone, torsion, TOA, fracture, dislocation  Patient  presenting to the ER for evaluation of symptoms as described above.  Based on symptoms, risk factors and considered above differential, this presenting complaint could reflect a potentially life-threatening illness therefore the patient will be placed on continuous pulse oximetry and telemetry for monitoring.  Laboratory evaluation will be sent to evaluate for the above complaints.  CT imaging will be ordered for the but differential will give IV morphine will give IV Zofran.  X-ray of knee on my review and interpretation does not show any evidence of fracture.   Clinical Course as of 10/12/22 1307  Wed Oct 12, 2022  1306 ET imaging without acute findings.  Will be placed in knee immobilizer.  Patient has crutches.  CT imaging is noting some findings concerning for pneumonia.  Given her multiple complaints cough and congestion with chills at home will cover with doxycycline.  Does appear stable and appropriate for outpatient follow-up. [PR]    Clinical Course User Index [PR] Willy Eddy, MD     FINAL CLINICAL IMPRESSION(S) / ED DIAGNOSES   Final diagnoses:  Right lower quadrant abdominal pain     Rx / DC Orders   ED Discharge Orders          Ordered    doxycycline (VIBRA-TABS) 100 MG tablet  2 times daily        10/12/22 1303    albuterol (VENTOLIN HFA) 108 (90 Base) MCG/ACT inhaler  Every 6 hours PRN        10/12/22 1303    ondansetron (ZOFRAN-ODT) 4 MG disintegrating tablet  Every 8 hours PRN        10/12/22 1303             Note:  This document was prepared using Dragon voice recognition software and may include unintentional dictation errors.    Willy Eddy, MD 10/12/22 [phone]

## 2022-10-12 NOTE — ED Triage Notes (Signed)
First Nurse Note  Pt via POV from [facility]. Pt c/o R sided pelvic pain and diarrhea. Pt has a hx of ovarian cyst on the R side. Also, c/o L knee pain and swelling for the past week. VSS.

## 2022-10-12 NOTE — Discharge Instructions (Signed)

## 2022-10-14 ENCOUNTER — Other Ambulatory Visit: Payer: Self-pay | Admitting: Psychiatry

## 2022-10-21 ENCOUNTER — Emergency Department
Admission: EM | Admit: 2022-10-21 | Discharge: 2022-10-22 | Disposition: A | Discharge status: Home / Self Care | Payer: Medicaid Other | Attending: Emergency Medicine | Admitting: Emergency Medicine

## 2022-10-21 ENCOUNTER — Emergency Department: Payer: Medicaid Other

## 2022-10-21 ENCOUNTER — Other Ambulatory Visit: Payer: Self-pay

## 2022-10-21 DIAGNOSIS — Z859 Personal history of malignant neoplasm, unspecified: Secondary | ICD-10-CM | POA: Diagnosis not present

## 2022-10-21 DIAGNOSIS — R0789 Other chest pain: Secondary | ICD-10-CM | POA: Diagnosis not present

## 2022-10-21 DIAGNOSIS — R059 Cough, unspecified: Secondary | ICD-10-CM | POA: Diagnosis not present

## 2022-10-21 DIAGNOSIS — J40 Bronchitis, not specified as acute or chronic: Secondary | ICD-10-CM

## 2022-10-21 NOTE — ED Triage Notes (Addendum)
Pt to ed from home via POV for cough, congestion. Pt was seen a week ago for same and was diagnosed with pneumonia. Pt has been taking her prescribed meds. Pt just feels worse. Pt is caox4, in no acute distress and ambulatory in triage.   Pt is very anxious in triage. 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 I had a friend who is also in nursing school check my lungs and she said they was wheezes so I came here". This RN assessed LS and has no wheezes.

## 2022-10-22 ENCOUNTER — Emergency Department: Payer: Medicaid Other

## 2022-10-22 ENCOUNTER — Encounter: Payer: Self-pay | Admitting: Certified Nurse Midwife

## 2022-10-22 DIAGNOSIS — R059 Cough, unspecified: Secondary | ICD-10-CM | POA: Diagnosis not present

## 2022-10-22 LAB — COMPREHENSIVE METABOLIC PANEL
ALT: 17 U/L (ref 0–44)
AST: 20 U/L (ref 15–41)
Albumin: 4.9 g/dL (ref 3.5–5.0)
Alkaline Phosphatase: 65 U/L (ref 38–126)
Anion gap: 12 (ref 5–15)
BUN: 18 mg/dL (ref 6–20)
CO2: 22 mmol/L (ref 22–32)
Calcium: 9.4 mg/dL (ref 8.9–10.3)
Chloride: 101 mmol/L (ref 98–111)
Creatinine, Ser: 0.74 mg/dL (ref 0.44–1.00)
GFR, Estimated: >60 mL/min (ref >60)
Glucose, Bld: 84 mg/dL (ref 70–99)
Potassium: 3.4 mmol/L — ABNORMAL LOW (ref 3.5–5.1)
Sodium: 135 mmol/L (ref 135–145)
Total Bilirubin: 0.8 mg/dL (ref 0.3–1.2)
Total Protein: 8.5 g/dL — ABNORMAL HIGH (ref 6.5–8.1)

## 2022-10-22 LAB — CBC WITH DIFFERENTIAL/PLATELET
Abs Immature Granulocytes: 0.06 10*3/uL (ref 0.00–0.07)
Basophils Absolute: 0.1 10*3/uL (ref 0.0–0.1)
Basophils Relative: 1 %
Eosinophils Absolute: 0 10*3/uL (ref 0.0–0.5)
Eosinophils Relative: 0 %
HCT: 39.4 % (ref 36.0–46.0)
Hemoglobin: 13.1 g/dL (ref 12.0–15.0)
Immature Granulocytes: 1 %
Lymphocytes Relative: 30 %
Lymphs Abs: 3.7 10*3/uL (ref 0.7–4.0)
MCH: 29.1 pg (ref 26.0–34.0)
MCHC: 33.2 g/dL (ref 30.0–36.0)
MCV: 87.6 fL (ref 80.0–100.0)
Monocytes Absolute: 0.8 10*3/uL (ref 0.1–1.0)
Monocytes Relative: 6 %
Neutro Abs: 7.8 10*3/uL — ABNORMAL HIGH (ref 1.7–7.7)
Neutrophils Relative %: 62 %
Platelets: 336 10*3/uL (ref 150–400)
RBC: 4.5 MIL/uL (ref 3.87–5.11)
RDW: 13.2 % (ref 11.5–15.5)
WBC: 12.5 10*3/uL — ABNORMAL HIGH (ref 4.0–10.5)
nRBC: 0 % (ref 0.0–0.2)

## 2022-10-22 LAB — LIPASE, BLOOD: Lipase: 31 U/L (ref 11–51)

## 2022-10-22 LAB — TROPONIN I (HIGH SENSITIVITY): Troponin I (High Sensitivity): 4 ng/L (ref <18)

## 2022-10-22 MED ORDER — HYDROCOD POLI-CHLORPHE POLI ER 10-8 MG/5ML PO SUER: 5.0000 mL | Freq: Two times a day (BID) | ORAL | 0 refills | Status: DC | PRN

## 2022-10-22 MED ORDER — SODIUM CHLORIDE 0.9 % IV BOLUS
1000.0000 mL | Freq: Once | INTRAVENOUS | Status: AC
  Administered 2022-10-22: 1000 mL via INTRAVENOUS

## 2022-10-22 MED ORDER — FAMOTIDINE IN NACL 20-0.9 MG/50ML-% IV SOLN
20.0000 mg | Freq: Once | INTRAVENOUS | Status: AC
  Administered 2022-10-22: 20 mg via INTRAVENOUS
  Filled 2022-10-22: qty 50

## 2022-10-22 MED ORDER — PREDNISONE 20 MG PO TABS: ORAL_TABLET | ORAL | 0 refills | Status: DC

## 2022-10-22 MED ORDER — IOHEXOL 350 MG/ML SOLN
75.0000 mL | Freq: Once | INTRAVENOUS | Status: AC | PRN
  Administered 2022-10-22: 75 mL via INTRAVENOUS

## 2022-10-22 MED ORDER — PREDNISONE 20 MG PO TABS
60.0000 mg | ORAL_TABLET | Freq: Once | ORAL | Status: AC
  Administered 2022-10-22: 60 mg via ORAL
  Filled 2022-10-22: qty 3

## 2022-10-22 NOTE — Discharge Instructions (Signed)
Take steroid as prescribed (Prednisone 60mg  until finished).  You may take cough medicine as needed.  Return to the ER for worsening symptoms, persistent vomiting, difficulty breathing or other concerns.

## 2022-10-22 NOTE — ED Provider Notes (Signed)
[facility] Provider Note    Event Date/Time   First MD Initiated Contact with Patient 10/22/22 0000     (approximate)   History   Cough, congestion   HPI  Dawn Morris is a 32 y.o. female who chief complaint, congestion, chest/rib/lung pain.  Patient diagnosed with pneumonia last week, taking doxycycline as prescribed.  States albuterol inhaler makes her dry.  Patient is a Theatre stage manager and concerned that something is very because another student listen to her lungs and noted wheezing.  Denies associated fever/chills, shortness of breath, abdominal pain, nausea , vomiting or dizziness.     Past Medical History   Past Medical History:  Diagnosis Date   Cancer [facility])    Gestational diabetes    Increased BMI      Active Problem List   Patient Active Problem List   Diagnosis Date Noted   Anxiety 08/04/2021   Family history of Hashimoto thyroiditis 06/11/2018   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     Past Surgical History   Past Surgical History:  Procedure Laterality Date   ACHILLES TENDON SURGERY Right 12/06/2017   Procedure: ACHILLES TENDON REPAIR;  Surgeon: Deeann Saint, MD;  Location: [facility] ORS;  Service: Orthopedics;  Laterality: Right;   MELANOMA EXCISION     TONSILLECTOMY  2005     Home Medications   Prior to Admission medications   Medication Sig Start Date End Date Taking? Authorizing Provider  chlorpheniramine-HYDROcodone (TUSSIONEX) 10-8 MG/5ML Take 5 mLs by mouth every 12 (twelve) hours as needed for cough. 10/22/22  Yes Irean Hong, MD  predniSONE (DELTASONE) 20 MG tablet 3 tablets daily x 4 days 10/22/22  Yes Irean Hong, MD  albuterol (VENTOLIN HFA) 108 (90 Base) MCG/ACT inhaler Inhale 2 puffs into the lungs every 6 (six) hours as needed for wheezing or shortness of breath. 10/12/22   Willy Eddy, MD  hydrOXYzine (ATARAX) 25 MG tablet Take 1 tablet (25 mg total) by mouth daily as needed for anxiety. 08/31/22    Neysa Hotter, MD  ondansetron (ZOFRAN-ODT) 4 MG disintegrating tablet Take 1 tablet (4 mg total) by mouth every 8 (eight) hours as needed for nausea or vomiting. 10/12/22   Willy Eddy, MD  sertraline (ZOLOFT) 100 MG tablet Take 1 tablet (100 mg total) by mouth at bedtime. 08/21/22 09/20/22  Neysa Hotter, MD  sertraline (ZOLOFT) 50 MG tablet Take 1 tablet (50 mg total) by mouth daily. 12/15/21   Doreene Burke, CNM     Allergies  Patient has no known allergies.   Family History   Family History  Problem Relation Age of Onset   Bipolar disorder Mother    Healthy Mother    Healthy Father    Healthy Sister    Healthy Maternal Aunt    Healthy Paternal Aunt    Healthy Maternal Uncle    Healthy Paternal Uncle    Healthy Maternal Grandmother    Cancer Neg Hx    Diabetes Neg Hx    Heart disease Neg Hx    Breast cancer Neg Hx    Ovarian cancer Neg Hx    Colon cancer Neg Hx      Physical Exam  Triage Vital Signs: ED Triage Vitals  Enc Vitals Group     BP 10/21/22 2127 (!) 170/90     Pulse Rate 10/21/22 2124 100     Resp 10/21/22 2124 16     Temp 10/21/22 2124 98.1 F (36.7 C)  Temp Source 10/21/22 2124 Oral     SpO2 10/21/22 2124 98 %     Weight 10/21/22 2126 146 lb 13.2 oz (66.6 kg)     Height 10/21/22 2126 5\' 5"  (1.651 m)     Head Circumference --      Peak Flow --      Pain Score 10/21/22 2126 0     Pain Loc --      Pain Edu? --      Excl. in GC? --     Updated Vital Signs: BP (!) 142/99   Pulse (!) 106   Temp 98.3 F (36.8 C) (Oral)   Resp (!) 22   Ht 5\' 5"  (1.651 m)   Wt 66.6 kg   LMP 10/08/2022   SpO2 100%   BMI 24.43 kg/m    General: Awake, no distress.  CV:  RRR.  Good peripheral perfusion.  Resp:  Normal effort. CTAB. Abd:  Nontender.  No distention.  Other:  No truncal vesicles.  Bilateral calf supple and nontender.   ED Results / Procedures / Treatments  Labs (all labs ordered are listed, but only abnormal results are displayed) Labs  Reviewed  CBC WITH DIFFERENTIAL/PLATELET - Abnormal; Notable for the following components:      Result Value   WBC 12.5 (*)    Neutro Abs 7.8 (*)    All other components within normal limits  COMPREHENSIVE METABOLIC PANEL - Abnormal; Notable for the following components:   Potassium 3.4 (*)    Total Protein 8.5 (*)    All other components within normal limits  LIPASE, BLOOD  TROPONIN I (HIGH SENSITIVITY)     EKG  ED ECG REPORT I, Estuardo Frisbee J, the attending physician, personally viewed and interpreted this ECG.   Date: 10/22/2022  EKG Time: 0104  Rate: 97  Rhythm: normal sinus rhythm  Axis: Borderline RAD  Intervals:none  ST&T Change: Nonspecific    RADIOLOGY I have independently visualized and interpreted patient's x-ray as well as noted the radiology interpretation:  X-ray: No acute cardiopulmonary process  CTA chest: No PE  Official radiology report(s): CT Angio Chest PE W/Cm &/Or Wo Cm  Result Date: 10/22/2022 CLINICAL DATA:  Cough and congestion. EXAM: CT ANGIOGRAPHY CHEST WITH CONTRAST TECHNIQUE: Multidetector CT imaging of the chest was performed using the standard protocol during bolus administration of intravenous contrast. Multiplanar CT image reconstructions and MIPs were obtained to evaluate the vascular anatomy. RADIATION DOSE REDUCTION: This exam was performed according to the departmental dose-optimization program which includes automated exposure control, adjustment of the mA and/or kV according to patient size and/or use of iterative reconstruction technique. CONTRAST:  75mL OMNIPAQUE IOHEXOL 350 MG/ML SOLN COMPARISON:  None Available. FINDINGS: Cardiovascular: The thoracic aorta is normal in appearance. Satisfactory opacification of the pulmonary arteries to the segmental level. No evidence of pulmonary embolism. Normal heart size. No pericardial effusion. Mediastinum/Nodes: No enlarged mediastinal, hilar, or axillary lymph nodes. Thyroid gland, trachea, and  esophagus demonstrate no significant findings. Lungs/Pleura: Lungs are clear. No pleural effusion or pneumothorax. Upper Abdomen: No acute abnormality. Musculoskeletal: No chest wall abnormality. No acute or significant osseous findings. Review of the MIP images confirms the above findings. IMPRESSION: No evidence of pulmonary embolism or acute cardiopulmonary disease. Electronically Signed   By: Aram Candela M.D.   On: 10/22/2022 02:04   DG Chest 2 View  Result Date: 10/21/2022 CLINICAL DATA:  Cough. EXAM: CHEST - 2 VIEW COMPARISON:  February 23, 2010 FINDINGS: The heart size  and mediastinal contours are within normal limits. Both lungs are clear. The visualized skeletal structures are unremarkable. IMPRESSION: No active cardiopulmonary disease. Electronically Signed   By: Aram Candela M.D.   On: 10/21/2022 21:52     PROCEDURES:  Critical Care performed: No  .1-3 Lead EKG Interpretation  Performed by: Irean Hong, MD Authorized by: Irean Hong, MD     Interpretation: normal     ECG rate:  95   ECG rate assessment: normal     Rhythm: sinus rhythm     Ectopy: none     Conduction: normal   Comments:     Placed on cardiac monitor to evaluate for arrhythmias    MEDICATIONS ORDERED IN ED: Medications  famotidine (PEPCID) IVPB 20 mg premix (0 mg Intravenous Stopped 10/22/22 0106)  sodium chloride 0.9 % bolus 1,000 mL (0 mLs Intravenous Stopped 10/22/22 0235)  iohexol (OMNIPAQUE) 350 MG/ML injection 75 mL (75 mLs Intravenous Contrast Given 10/22/22 0148)  predniSONE (DELTASONE) tablet 60 mg (60 mg Oral Given 10/22/22 0234)     IMPRESSION / MDM / ASSESSMENT AND PLAN / ED COURSE  I reviewed the triage vital signs and the nursing notes.                             32 year old female with continued cough, congestion and pleuritic chest pain. Differential diagnosis includes, but is not limited to, ACS, aortic dissection, pulmonary embolism, cardiac tamponade, pneumothorax, pneumonia,  pericarditis, myocarditis, GI-related causes including esophagitis/gastritis, and musculoskeletal chest wall pain.   I have personally reviewed patient's records and note her ED visit from 09/12/2022.  Patient's presentation is most consistent with acute presentation with potential threat to life or bodily function.  The patient is on the cardiac monitor to evaluate for evidence of arrhythmia and/or significant heart rate changes.  Chest x-ray unremarkable.  Given patient's complaint of pleuritic chest pain, will proceed with lab work and CTA chest to evaluate for PE.  Administer IV fluids and IV Pepcid for heartburn.  Clinical Course as of 10/22/22 0410  Sat Oct 22, 2022  0212 Laboratory results unremarkable.  CTA chest negative for PE.  Will place on prednisone burst and patient will follow-up closely with her PCP.  Strict return precautions given.  Patient verbalizes understanding and agrees with plan of care. [JS]    Clinical Course User Index [JS] Irean Hong, MD     FINAL CLINICAL IMPRESSION(S) / ED DIAGNOSES   Final diagnoses:  Bronchitis     Rx / DC Orders   ED Discharge Orders          Ordered    predniSONE (DELTASONE) 20 MG tablet        10/22/22 0213    chlorpheniramine-HYDROcodone (TUSSIONEX) 10-8 MG/5ML  Every 12 hours PRN        10/22/22 0214             Note:  This document was prepared using Dragon voice recognition software and may include unintentional dictation errors.   Irean Hong, MD 10/22/22 [phone]

## 2022-10-22 NOTE — ED Notes (Signed)
Acuity changed to level 3 per Dr. Ardine Bjork request

## 2022-10-26 ENCOUNTER — Other Ambulatory Visit: Payer: Self-pay | Admitting: Certified Nurse Midwife

## 2022-10-26 DIAGNOSIS — R1084 Generalized abdominal pain: Secondary | ICD-10-CM

## 2022-10-26 DIAGNOSIS — R198 Other specified symptoms and signs involving the digestive system and abdomen: Secondary | ICD-10-CM

## 2023-03-05 IMAGING — US US OB < 14 WEEKS - US OB TV
1 series · 15 of 28 positions shown · non-contrast
Comparison: 09/28/2020

CLINICAL DATA: Viability

EXAM:
OBSTETRIC <14 WK US AND TRANSVAGINAL OB US
TECHNIQUE: Both transabdominal and transvaginal ultrasound examinations were
performed for complete evaluation of the gestation as well as the
maternal uterus, adnexal regions, and pelvic cul-de-sac.
Transvaginal technique was performed to assess early pregnancy.

[Series 1: us ob < 14 weeks - us ob tv · 57 acquisitions, 15 frames shown]
[im 1/57]
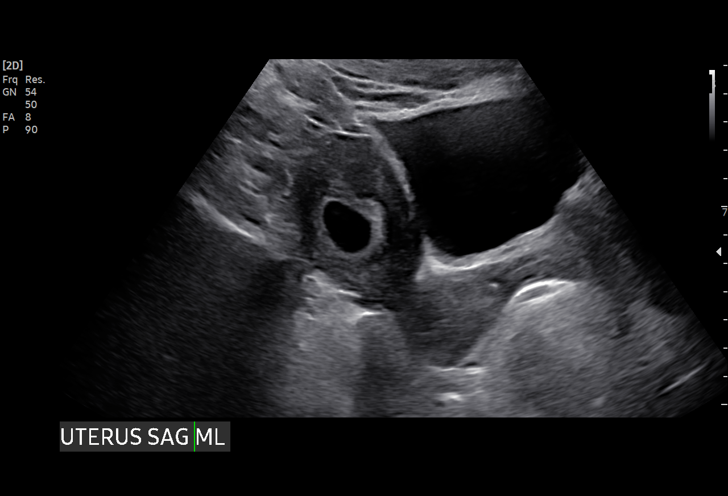
[im 5/57]
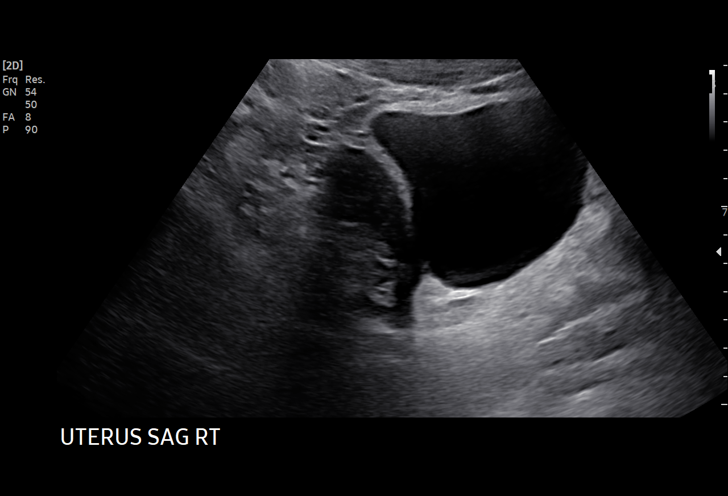
[im 9/57]
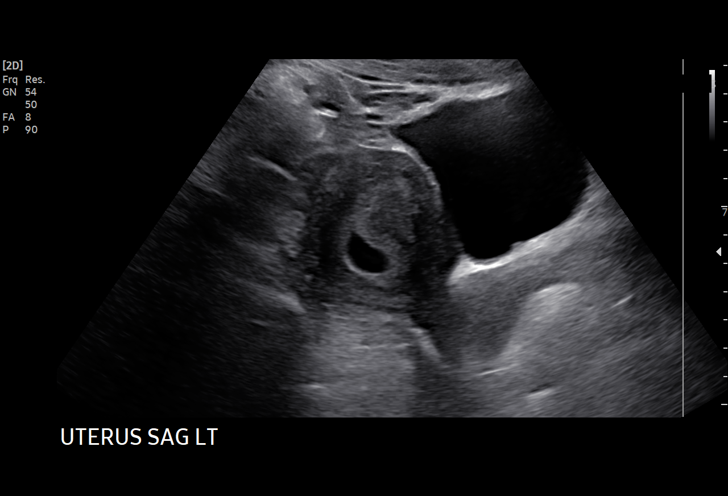
[im 13/57]
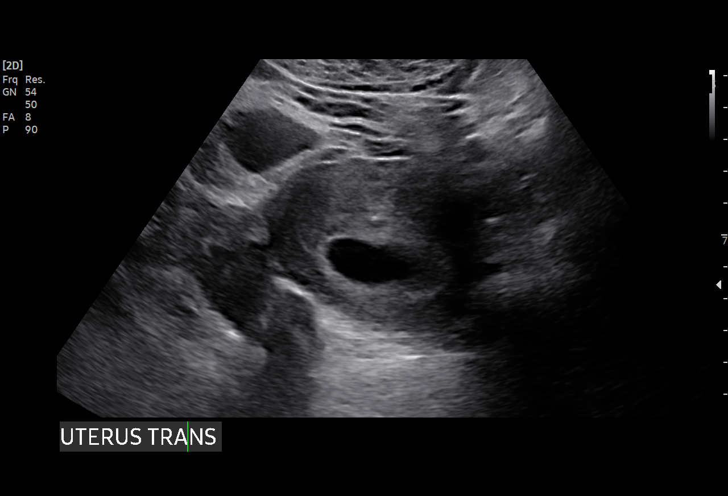
[im 17/57]
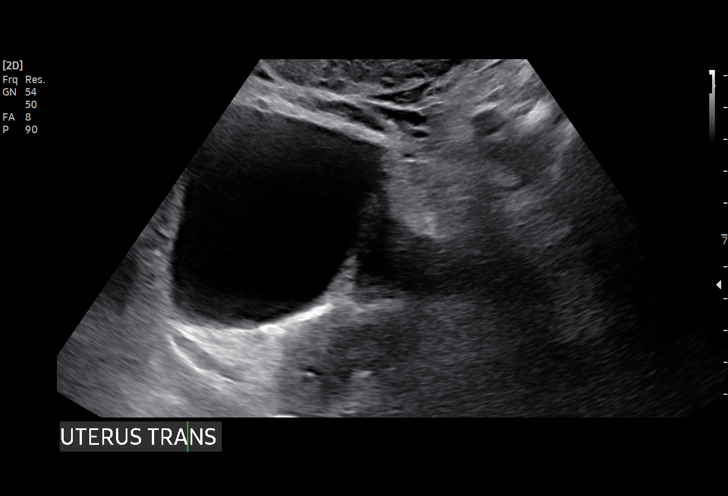
[im 21/57]
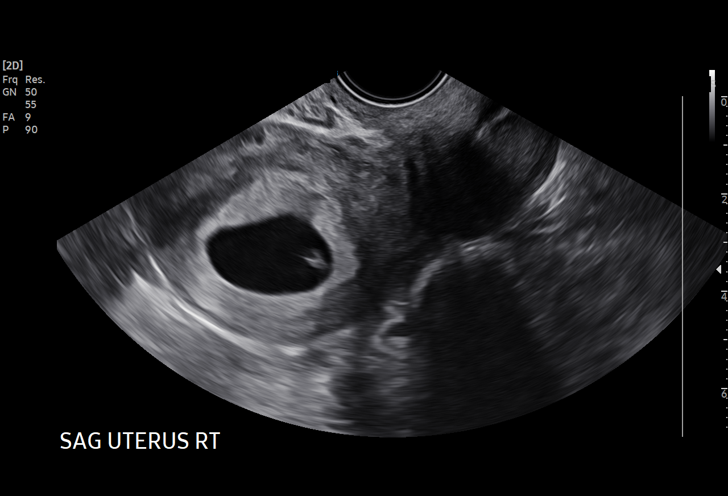
[im 25/57]
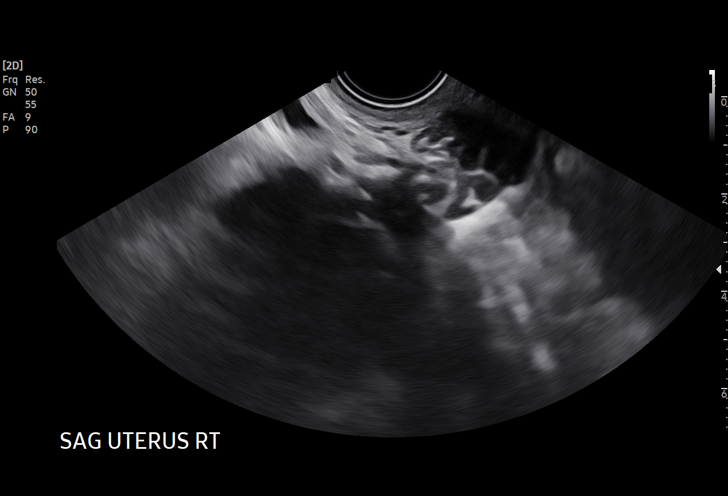
[im 30/57]
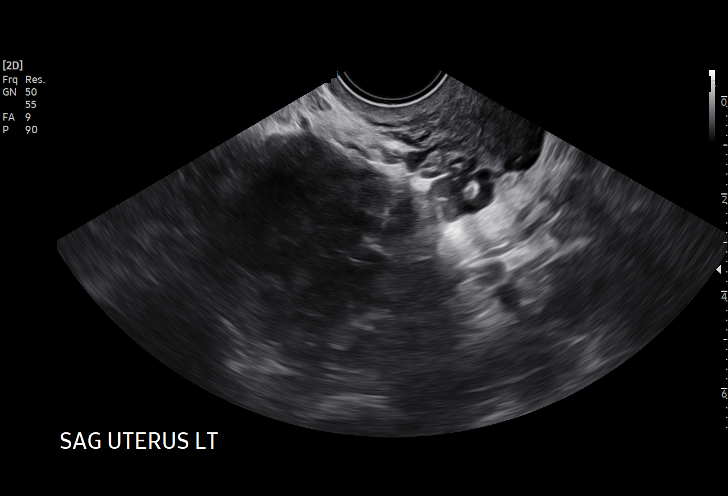
[im 32/57]
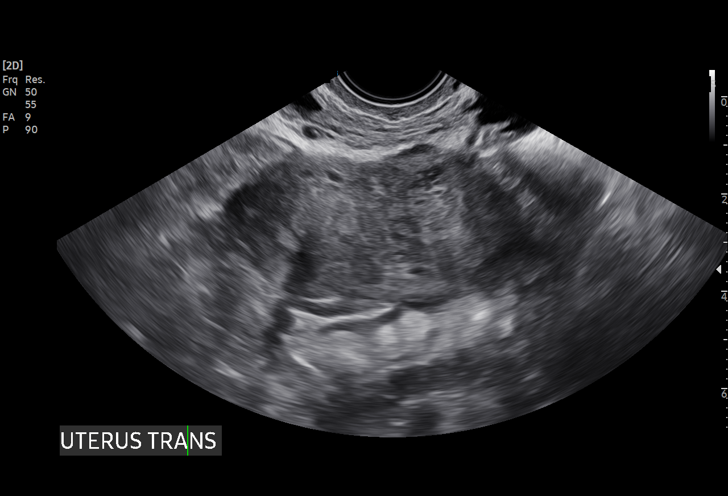
[im 36/57]
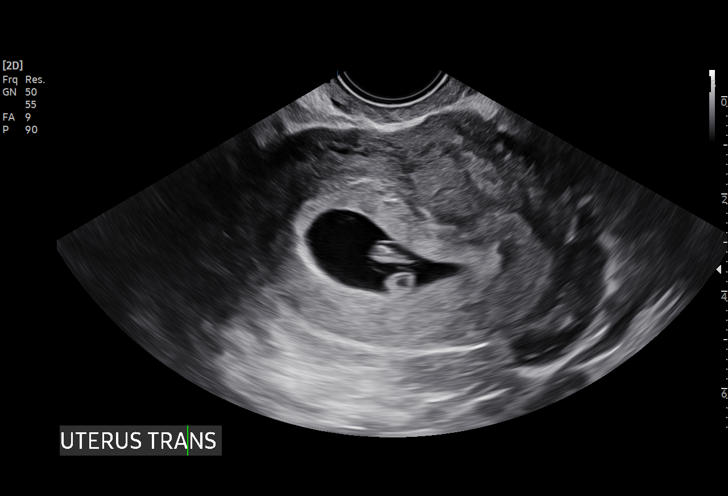
[im 40/57]
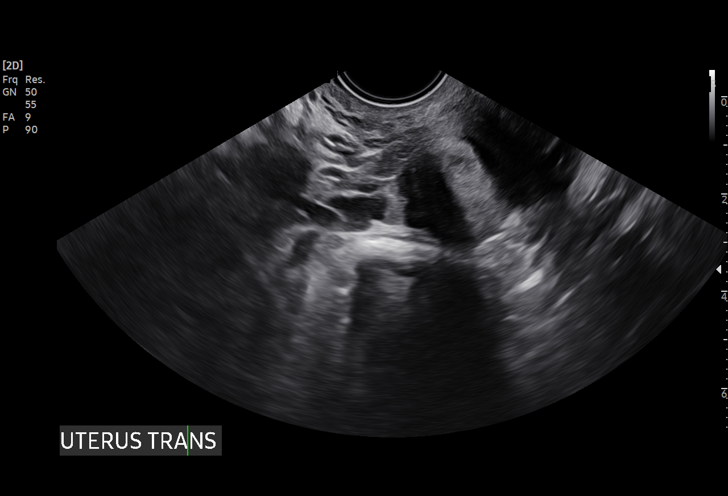
[im 44/57]
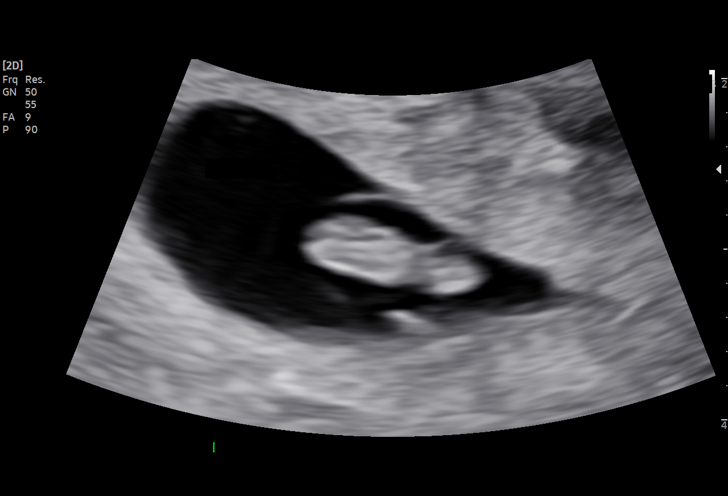
[im 48/57]
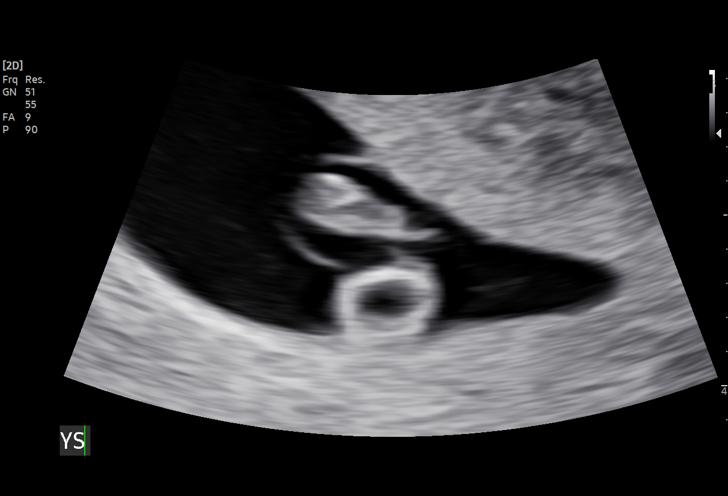
[im 52/57]
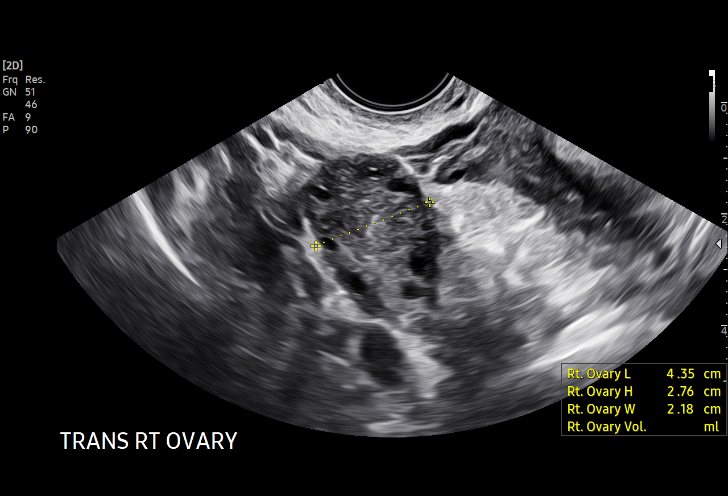
[im 57/57]
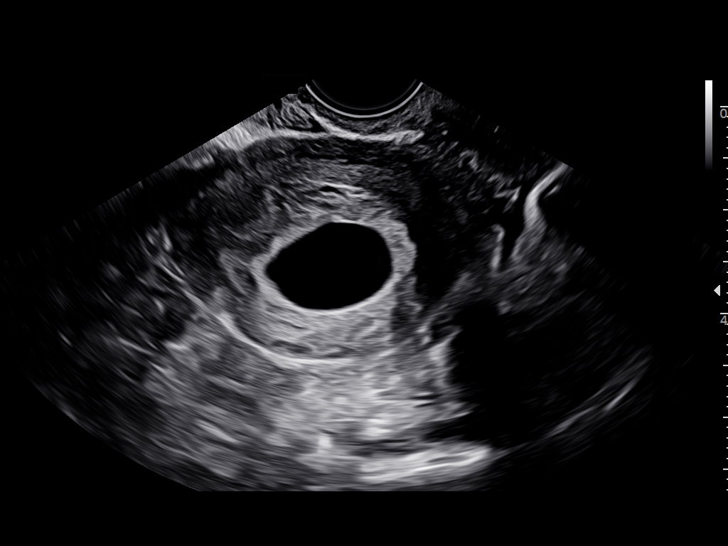

[15 of 28 positions shown; findings below may reference images not displayed]

FINDINGS: Intrauterine gestational sac: Single intrauterine gestation

Yolk sac:  Visualized

Embryo:  Visualized

Cardiac Activity: Visualized

Heart Rate: 140 bpm

CRL: 11 mm   7 w   1 d                  US EDC: 05/31/2021

Subchorionic hemorrhage:  None visualized.

Maternal uterus/adnexae: Ovaries are within normal limits. Right
ovary measures 4.4 x 2.8 x 2.2 cm. Left ovary measures 2.8 x 1.5 x
1.2 cm. No significant free fluid
IMPRESSION: Single viable intrauterine pregnancy with estimated sonographic age
of 7 weeks 1 day and ultrasound EDC of 05/31/2021

## 2023-05-22 IMAGING — US US OB COMP +14 WK
1 series · 15 of 28 positions shown · non-contrast
Comparison: none

CLINICAL DATA: Second trimester pregnancy for fetal anatomy survey.

EXAM:
OBSTETRICAL ULTRASOUND >14 WKS

[Series 1: us ob follow up · 15 of 75 slices shown]
[im 1/75]
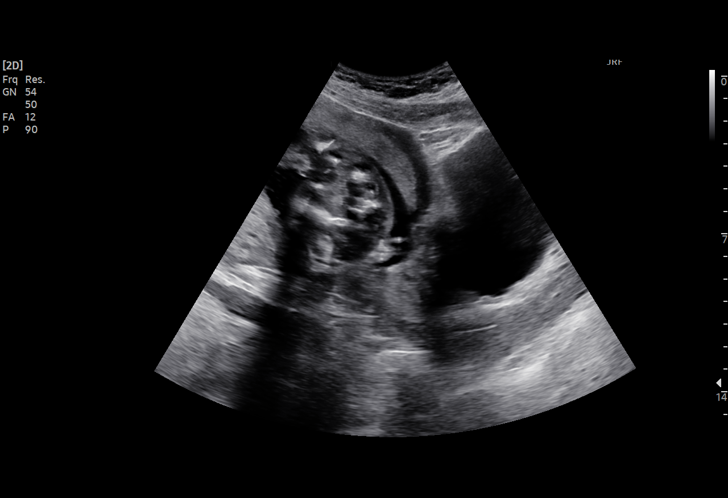
[im 6/75]
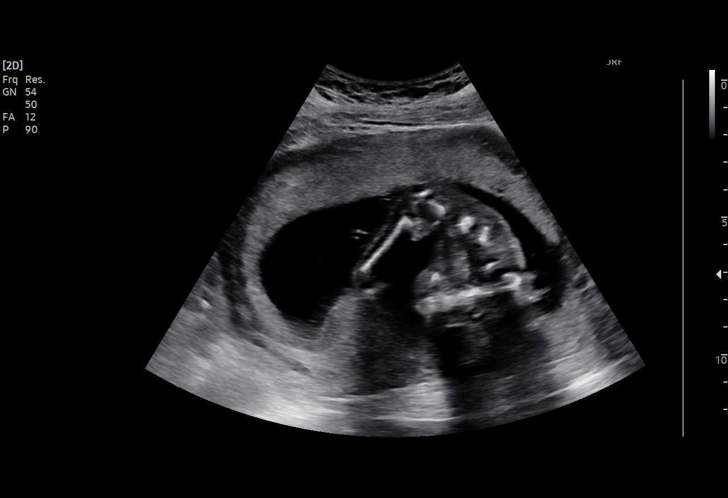
[im 11/75]
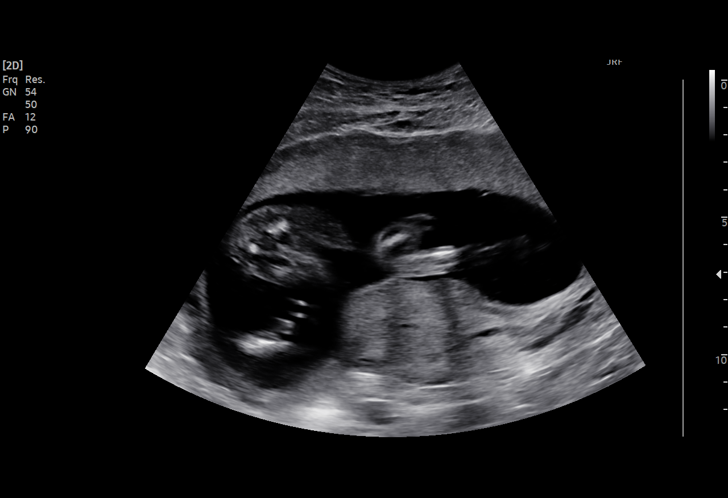
[im 17/75]
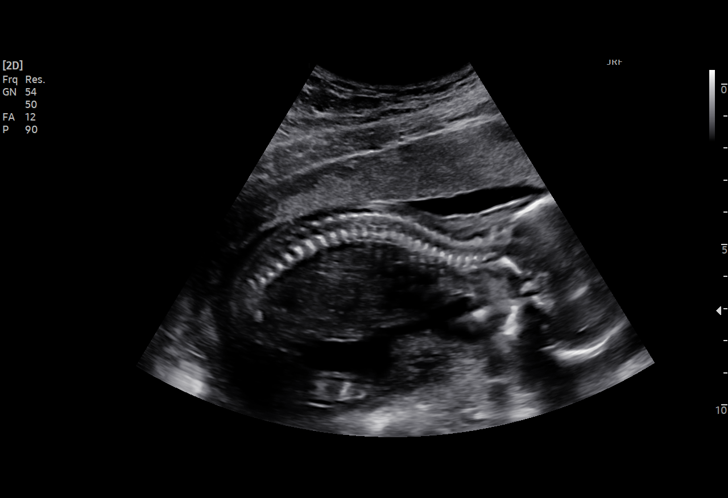
[im 22/75]
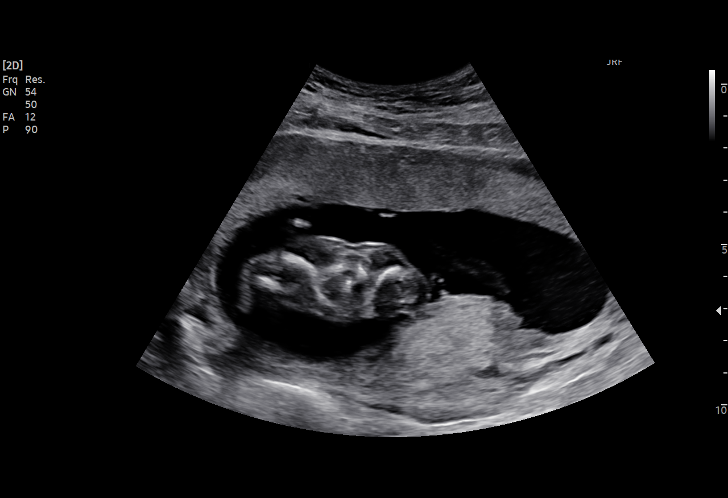
[im 28/75]
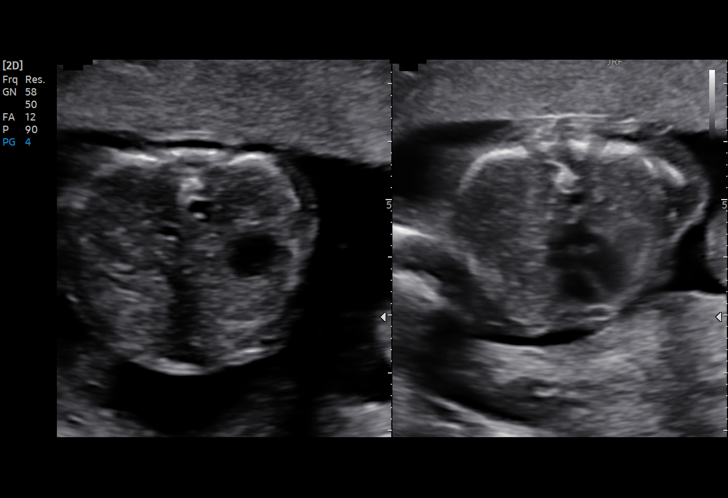
[im 33/75]
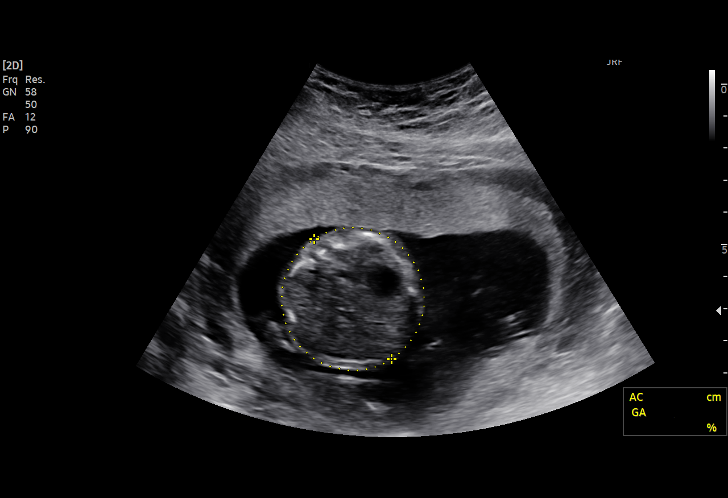
[im 39/75]
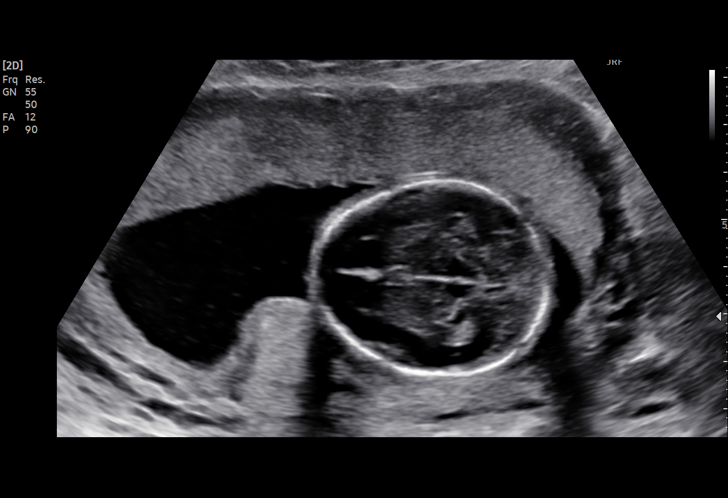
[im 42/75]
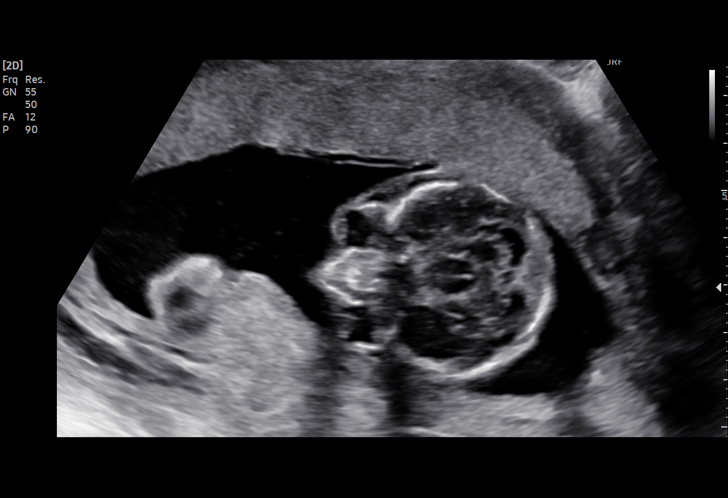
[im 47/75]
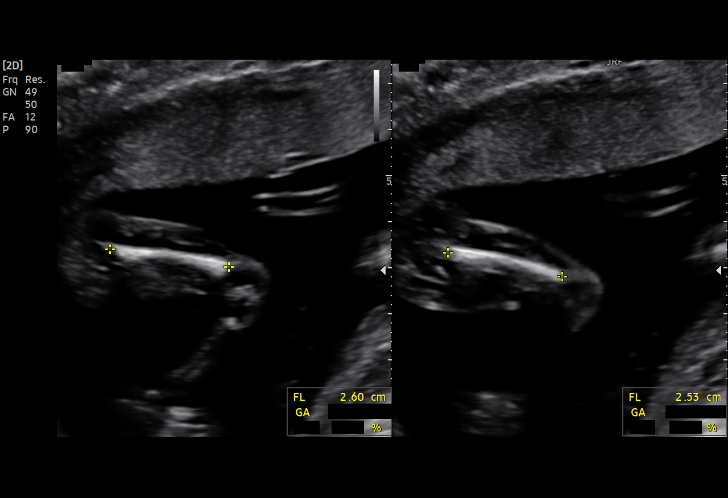
[im 53/75]
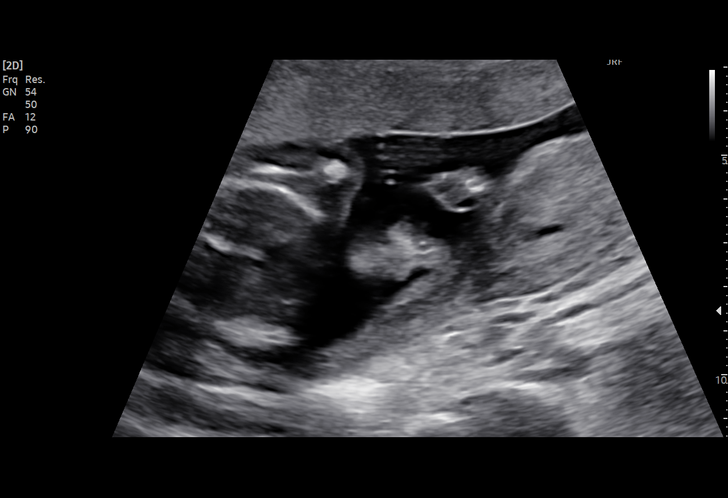
[im 58/75]
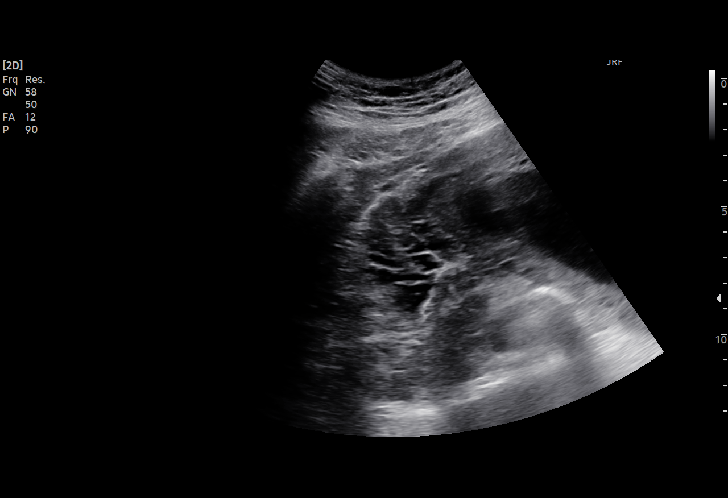
[im 64/75]
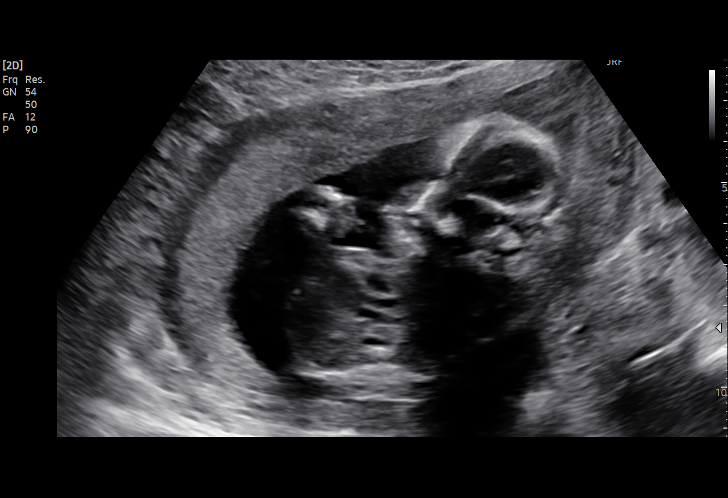
[im 69/75]
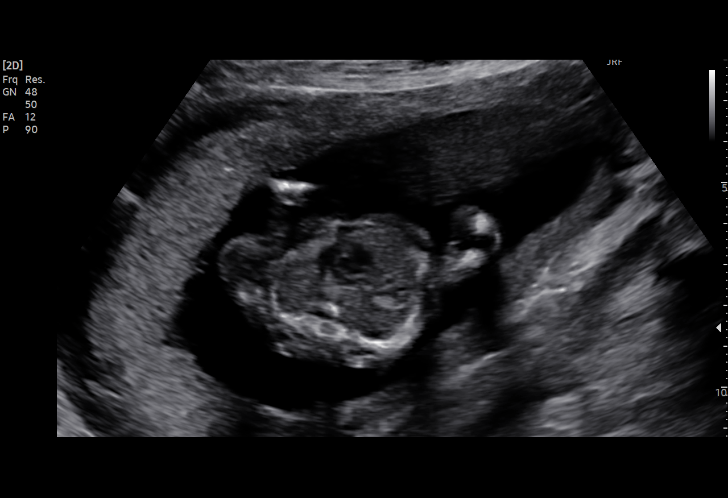
[im 75/75]
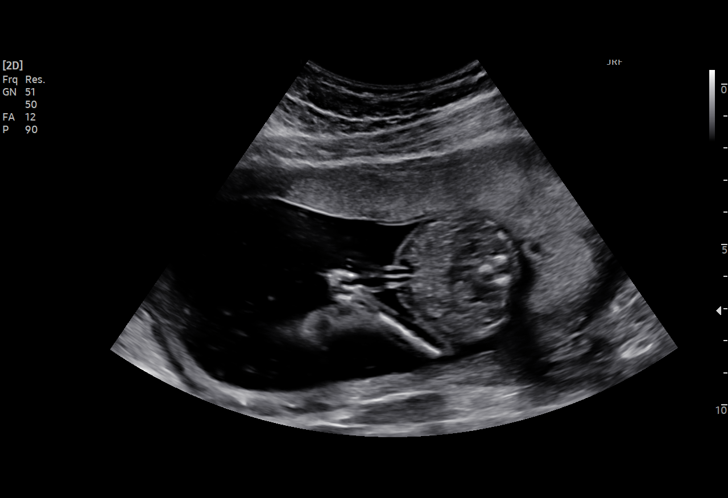

[15 of 28 positions shown; findings below may reference images not displayed]

FINDINGS: Number of Fetuses: 1

Heart Rate:  153 bpm

Movement: Yes

Presentation: Cephalic

Previa: No

Placental Location: Anterior

Amniotic Fluid (Subjective): Within normal limits

Amniotic Fluid (Objective):

Vertical pocket = 2.9cm

FETAL BIOMETRY

BPD: 4.1cm 18w 3d

HC:   15.0cm 18w 0d

AC:   13.9cm 19w 2d

FL:   2.6cm 17w 5d

Current Mean GA: 18w 3d US EDC: 05/30/2021

Assigned GA:  18w 2d Assigned EDC: 05/31/2021

FETAL ANATOMY

Lateral Ventricles: Appears normal

Thalami/CSP: Appears normal

Posterior Fossa:  Appears normal

Nuchal Region: Appears normal   NFT= 2.9 mm

Upper Lip: Appears normal

Spine: Appears normal

4 Chamber Heart on Left: Appears normal

LVOT: Appears normal

RVOT: Appears normal

Stomach on Left: Appears normal

3 Vessel Cord: Appears normal

Cord Insertion site: Appears normal

Kidneys: Appears normal

Bladder: Appears normal

Extremities: Appears normal

Sex: Male

Maternal Findings:

Cervix:  4.0 cm TA
IMPRESSION: Assigned GA currently 18 weeks 2 days.  Appropriate fetal growth.

Unremarkable fetal anatomic survey.  No fetal anomalies identified.

## 2023-09-20 IMAGING — US US MFM FETAL BPP W/O NON-STRESS
1 series · 15 of 28 positions shown · non-contrast
Comparison: none

[Series 1: us mfm fetal bpp w/o non-stress · 30 acquisitions, 15 frames shown]
[im 1/30]
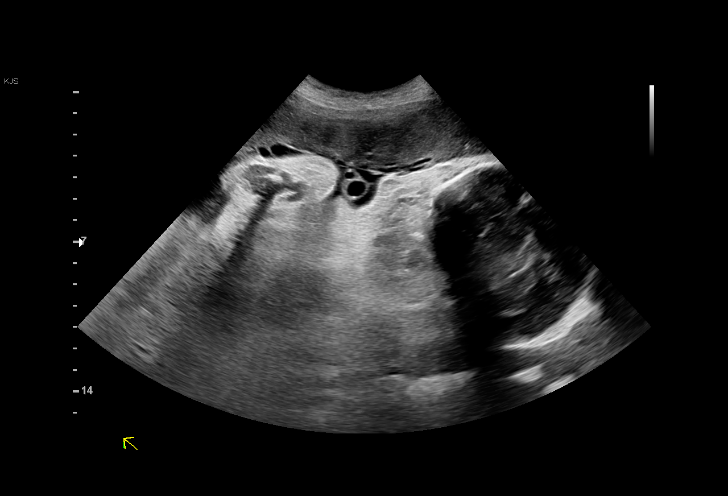
[im 3/30]
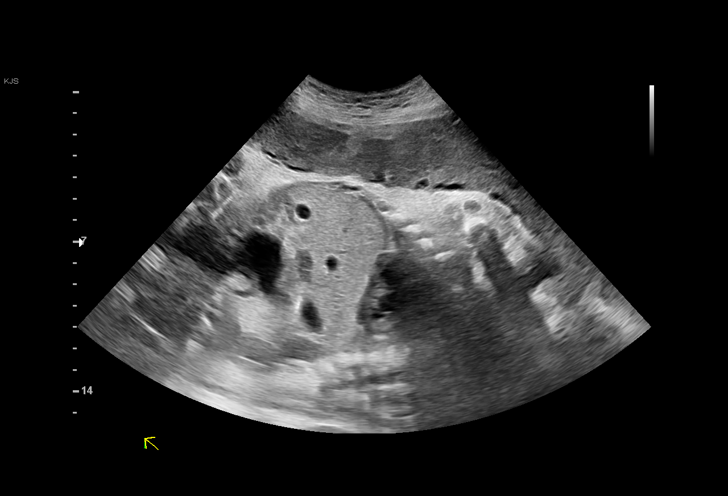
[im 5/30]
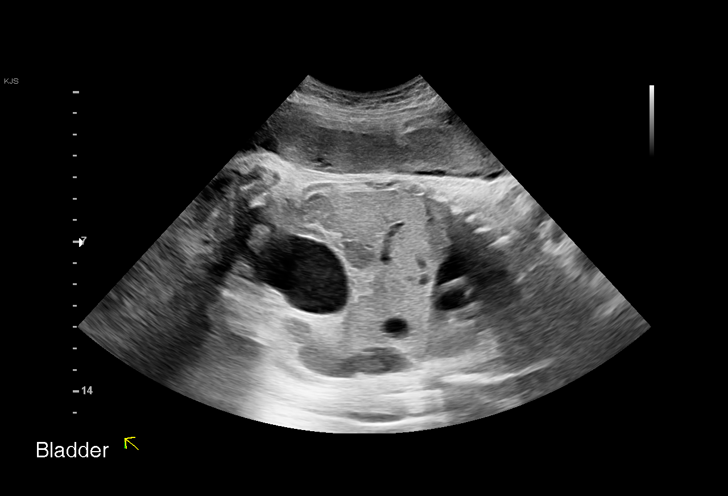
[im 7/30]
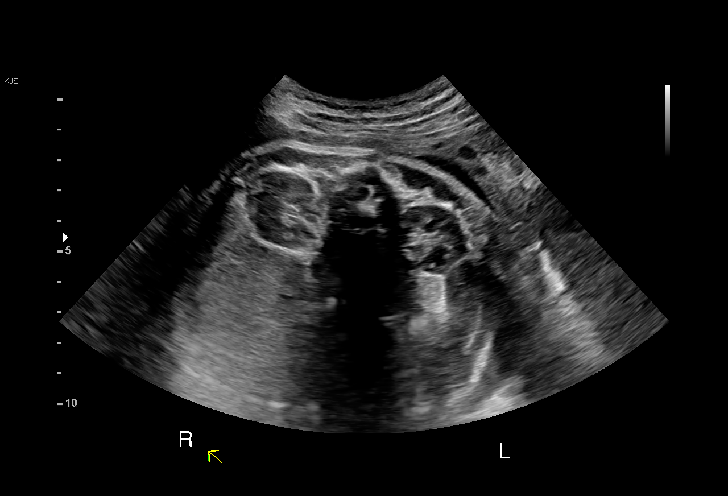
[im 9/30]
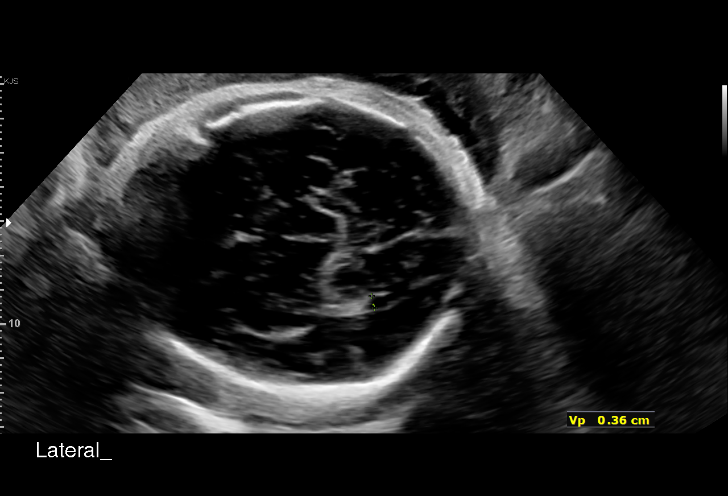
[im 11/30]
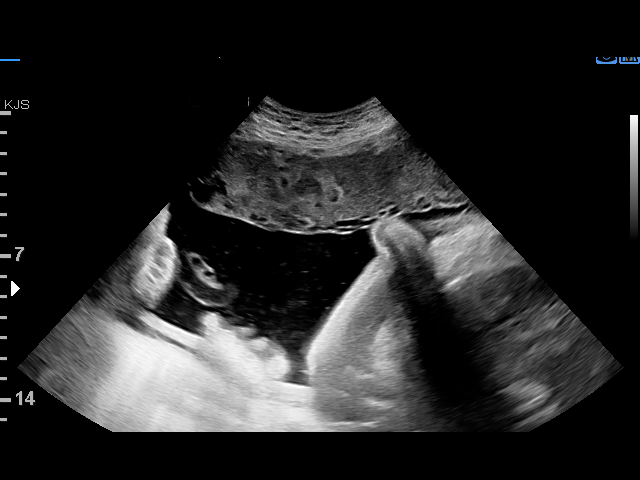
[im 13/30]
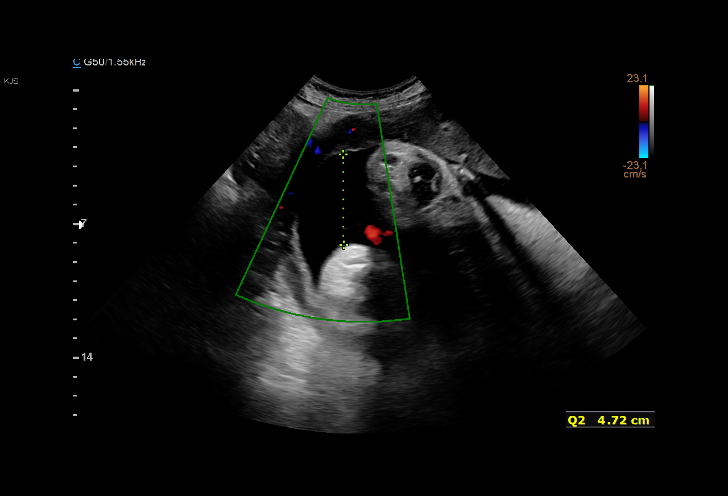
[im 16/30]
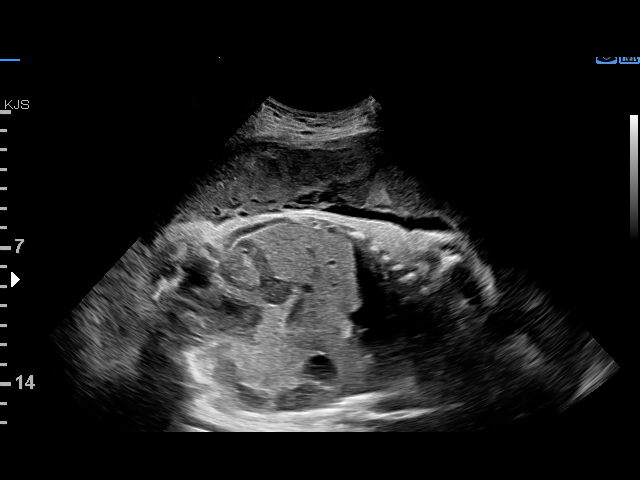
[im 17/30]
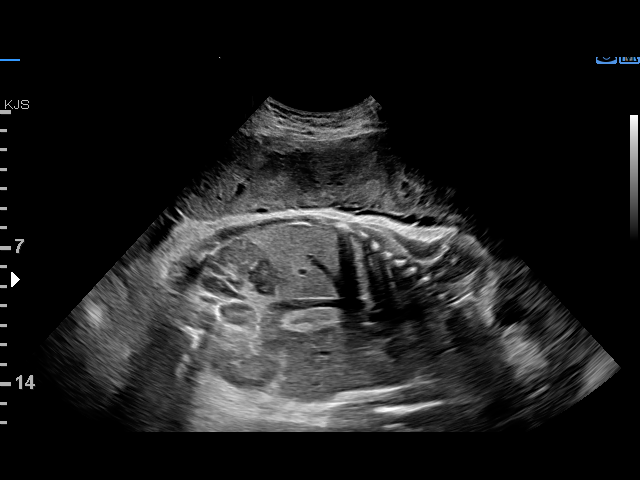
[im 19/30]
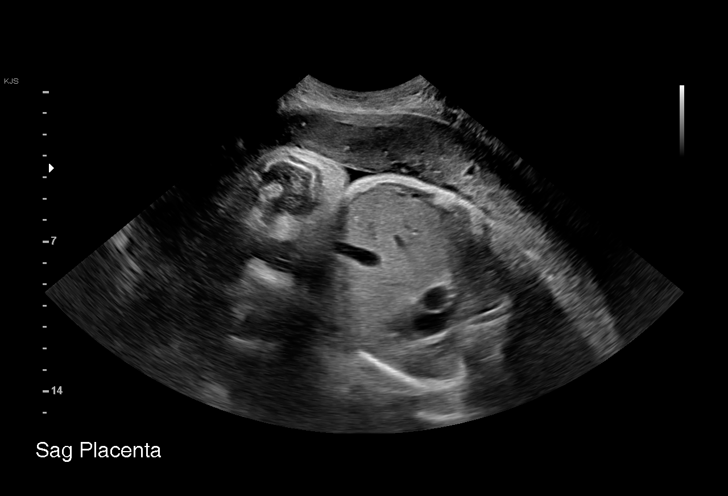
[im 21/30]
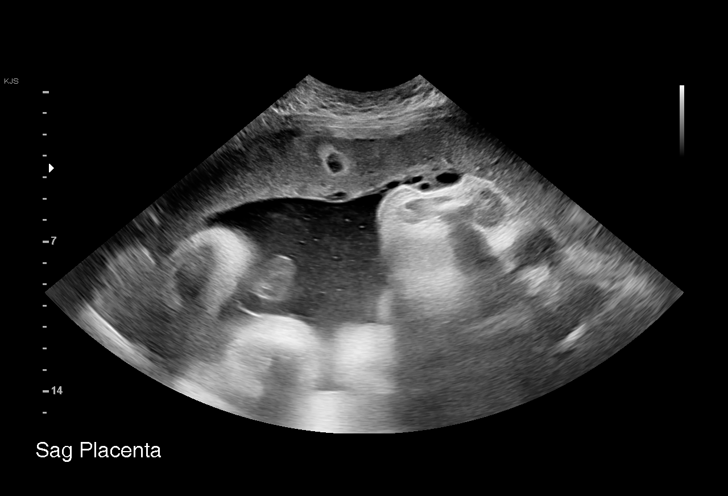
[im 23/30]
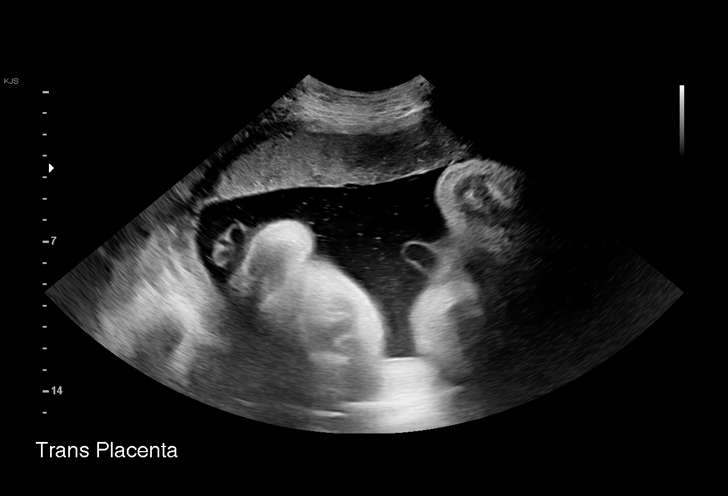
[im 25/30]
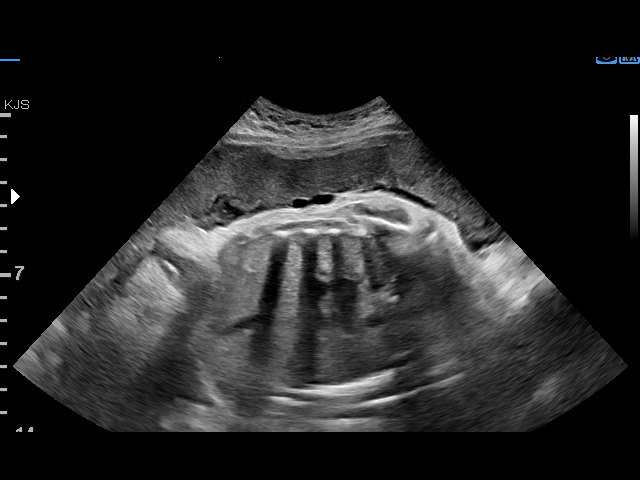
[im 27/30]
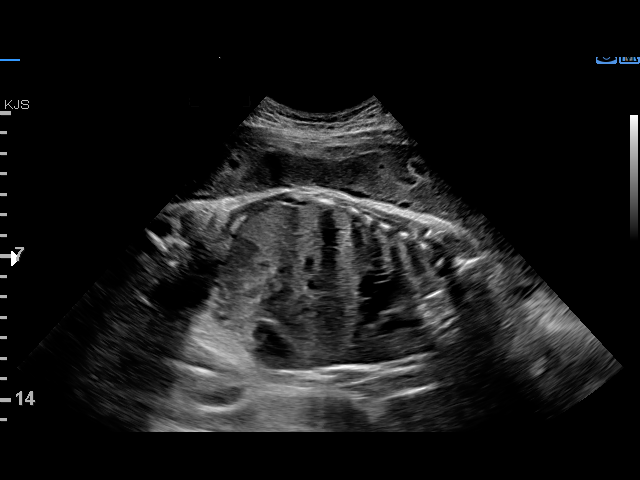
[im 30/30]
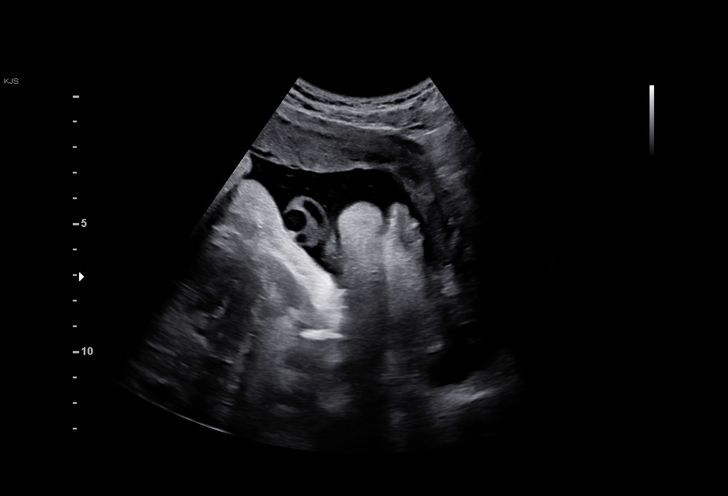

[15 of 28 positions shown; findings below may reference images not displayed]

[REDACTED]
                   JIM CNM

Indications

 Hypertension - Gestational (labetalol)
 Gestational diabetes in pregnancy, diet
 controlled
 LR NIPS
 35 weeks gestation of pregnancy
Fetal Evaluation

 Num Of Fetuses:         1
 Fetal Heart Rate(bpm):  129
 Cardiac Activity:       Observed
 Placenta:               Anterior
 P. Cord Insertion:      Previously Visualized

 Amniotic Fluid
 AFI FV:      Within normal limits

 AFI Sum(cm)     %Tile       Largest Pocket(cm)
 19.53           73

 RUQ(cm)       RLQ(cm)       LUQ(cm)        LLQ(cm)

Biophysical Evaluation

 Amniotic F.V:   Within normal limits       F. Tone:        Observed
 F. Movement:    Observed                   Score:          [DATE]
 F. Breathing:   Observed
Biometry

 LV:        3.6  mm
OB History

 Gravidity:    3         Term:   1         SAB:   1
 Living:       1
Gestational Age

 LMP:           37w 0d        Date:  08/14/20                 EDD:   05/21/21
 Best:          35w 4d     Det. By:  Early Ultrasound         EDD:   05/31/21
                                     (10/13/20)
Anatomy

 Ventricles:            Appears normal         Kidneys:                Appear normal
 Diaphragm:             Appears normal         Bladder:                Appears normal
 Stomach:               Appears normal, left
                        sided
Cervix Uterus Adnexa

 Cervix
 Not visualized (advanced GA >71wks)
Comments

 This patient was seen for a follow up growth scan and BPP
 due to chronic hypertension treated with labetalol and diet
 controlled gestational diabetes.  She denies any problems
 since her last exam.
 The amniotic fluid level appears appropriate for her
 gestational age.
 A BPP performed today was [DATE].
 She will return in 1 week for another BPP.
 She is already scheduled for delivery on May 12, 2021.

## 2024-03-15 ENCOUNTER — Ambulatory Visit (INDEPENDENT_AMBULATORY_CARE_PROVIDER_SITE_OTHER)

## 2024-03-15 VITALS — BP 116/72 | HR 78 | Resp 16 | Ht 65.0 in | Wt 158.5 lb

## 2024-03-15 DIAGNOSIS — O3680X0 Pregnancy with inconclusive fetal viability, not applicable or unspecified: Secondary | ICD-10-CM

## 2024-03-15 DIAGNOSIS — Z3201 Encounter for pregnancy test, result positive: Secondary | ICD-10-CM | POA: Diagnosis not present

## 2024-03-15 DIAGNOSIS — N912 Amenorrhea, unspecified: Secondary | ICD-10-CM

## 2024-03-15 LAB — POCT URINE PREGNANCY: Preg Test, Ur: POSITIVE — AB

## 2024-03-15 NOTE — Progress Notes (Signed)
    NURSE VISIT NOTE  Subjective:    Patient ID: Dawn Morris, female    DOB: 24-May-1990, 33 y.o.   MRN: [id number]  HPI  Patient is a 33 y.o. [id number] female who presents for evaluation of amenorrhea. She believes she could be pregnant. Pregnancy is desired. Sexual Activity: single partner, contraception: condoms. Current symptoms also include: breast tenderness, fatigue, frequent urination, morning sickness, nausea, and positive home pregnancy test. Last period was normal.    Objective:    Ht 5' 5 (1.651 m)   BMI 24.43 kg/m   Lab Review  Results for orders placed or performed in visit on 03/15/24  POCT urine pregnancy  Result Value Ref Range   Preg Test, Ur Positive (A) Negative    Assessment:   1. Amenorrhea     Plan:   Pregnancy Test: Positive  Estimated Date of Delivery: None noted. BP Cuff Measurement taken. Cuff Size Adult Small Encouraged well-balanced diet, plenty of rest when needed, pre-natal vitamins daily and walking for exercise.  Discussed self-help for nausea, avoiding OTC medications until consulting provider or pharmacist, other than Tylenol  as needed, minimal caffeine (1-2 cups daily) and avoiding alcohol.   She will schedule her nurse visit @ 7-[redacted] wks pregnant, u/s for dating @10  wk, and NOB visit at [redacted] wk pregnant.    Feel free to call with any questions.     Camelia Fetters, CMA  [facility]

## 2024-03-15 NOTE — Patient Instructions (Signed)
 Morning Sickness  Morning sickness is when you throw up or feel like you may throw up during pregnancy. This condition often occurs in the morning, but it can also occur at any time of day. Morning sickness is most common during the first three months of pregnancy, but it can go on throughout the pregnancy. Morning sickness is usually harmless. But if you throw up all the time, you should see your health care provider. You may also hear this condition called nausea and vomiting of pregnancy. What are the causes? The cause of morning sickness is not known. It may be linked to changes in hormones during pregnancy. What increases the risk? You're more likely to have morning sickness if: You had morning sickness in another pregnancy. You're pregnant with more than one baby, such as twins. You had morning sickness in other pregnancies. You have had motion sickness before you were pregnant. You have had bad headaches or migraines before you were pregnant. What are the signs or symptoms? Symptoms of morning sickness include: Feeling like you may throw up. Throwing up. How is this diagnosed? Morning sickness is diagnosed based on your symptoms. How is this treated? Treatment is usually not needed for morning sickness. You may only need to change what you eat. In some cases, your provider may give you: Vitamin B6 supplements. Medicines to prevent throwing up. Ginger. Follow these instructions at home: Medicines Take your medicines only as told by your provider. Do not use any prescription, over-the-counter, or herbal medicines for morning sickness without first talking with your provider. Take prenatal vitamins. These can stop or lessen the symptoms of morning sickness. If you feel like you may throw up after taking prenatal vitamins, take them at night or with a snack. Eating and drinking     Eat dry toast or crackers before getting out of bed. Eat 5 or 6 small meals a day. Try ginger  ale made with real ginger, ginger tea, or ginger candies. Drink fluids throughout the day. Eat protein foods when you need a snack. Nuts, yogurt, and cheese are good choices. Eat dry and bland foods like rice or baked potatoes. Foods that are high in carbohydrates are often helpful. Have someone cook for you if the smell of food makes you want to throw up. Foods to avoid Greasy foods. Fatty foods. Spicy foods. General instructions Try to avoid smells that make you feel sick. Use an air purifier to keep the air in your house free of smells. Try using an acupressure wristband. This is a wristband that's used to treat motion sickness. Try acupuncture. In this treatment, a provider puts thin needles into certain areas of your body to make you feel better. Brush your teeth after throwing up or rinse with a mix of baking soda and water. The acid in throw-up can hurt your teeth. Contact a health care provider if: Your symptoms do not get better. You feel dizzy or light-headed. You're losing weight. Get help right away if: The feeling that you may throw up will not go away, or you can't stop throwing up. You faint. You have very bad pain in your belly. This information is not intended to replace advice given to you by your health care provider. Make sure you discuss any questions you have with your health care provider. Document Revised: 02/02/2023 Document Reviewed: 08/11/2022 Elsevier Patient Education  2024 [employer].  First Trimester of Pregnancy  The first trimester of pregnancy starts on the first day of your  last monthly period until the end of week 13. This is months 1 through 3 of pregnancy. A week after a sperm fertilizes an egg, the egg will implant into the wall of the uterus and begin to develop into a baby. Body changes during your first trimester Your body goes through many changes during pregnancy. The changes usually return to normal after your baby is born. Physical  changes Your breasts may grow larger and may hurt. The area around your nipples may get darker. Your periods will stop. Your hair and nails may grow faster. You may pee more often. Health changes You may tire easily. Your gums may bleed and may be sensitive when you brush and floss. You may not feel hungry. You may have heartburn. You may throw up or feel like you may throw up. You may want to eat some foods, but not others. You may have headaches. You may have trouble pooping (constipation). Other changes Your emotions may change from day to day. You may have more dreams. Follow these instructions at home: Medicines Talk to your health care provider if you're taking medicines. Ask if the medicines are safe to take during pregnancy. Your provider may change the medicines that you take. Do not take any medicines unless told to by your provider. Take a prenatal vitamin that has at least 600 micrograms (mcg) of folic acid. Do not use herbal medicines, illegal substances, or medicines that are not approved by your provider. Eating and drinking While you're pregnant your body needs extra food for your growing baby. Talk with your provider about what to eat while pregnant. Activity Most women are able to exercise during pregnancy. Exercises may need to change as your pregnancy goes on. Talk to your provider about your activities and exercise routines. Relieving pain and discomfort Wear a good, supportive bra if your breasts hurt. Rest with your legs raised if you have leg cramps or low back pain. Safety Wear your seatbelt at all times when you're in a car. Talk to your provider if someone hits you, hurts you, or yells at you. Talk with your provider if you're feeling sad or have thoughts of hurting yourself. Lifestyle Certain things can be harmful while you're pregnant. Follow these rules: Do not use hot tubs, steam rooms, or saunas. Do not douche. Do not use tampons or scented  pads. Do not drink alcohol,smoke, vape, or use products with nicotine or tobacco in them. If you need help quitting, talk with your provider. Avoid cat litter boxes and soil used by cats. These things carry germs that can cause harm to your pregnancy and your baby. General instructions Keep all follow-up visits. It helps you and your unborn baby stay as healthy as possible. Write down your questions. Take them to your visits. Your provider will: Talk with you about your overall health. Give you advice or refer you to specialists who can help with different needs, including: Prenatal education classes. Mental health and counseling. Foods and healthy eating. Ask for help if you need help with food. Call your dentist and ask to be seen. Brush your teeth with a soft toothbrush. Floss gently. Where to find more information American Pregnancy Association: americanpregnancy.org [employer]: acog.org Office on [employer] Health: [employer] Contact a health care provider if: You feel dizzy, faint, or have a fever. You vomit or have watery poop (diarrhea) for 2 days or more. You have abnormal discharge or bleeding from your vagina. You have pain when  you pee or your pee smells bad. You have cramps, pain, or pressure in your belly area. Get help right away if: You have trouble breathing or chest pain. You have any kind of injury, such as from a fall or a car crash. These symptoms may be an emergency. Get help right away. Call 911. Do not wait to see if the symptoms will go away. Do not drive yourself to the hospital. This information is not intended to replace advice given to you by your health care provider. Make sure you discuss any questions you have with your health care provider. Document Revised: 02/02/2023 Document Reviewed: 09/02/2022 Elsevier Patient Education  2024 [employer] Inc.  Commonly Asked Questions During Pregnancy  Cats: A parasite  can be excreted in cat feces.  To avoid exposure you need to have another person empty the little box.  If you must empty the litter box you will need to wear gloves.  Wash your hands after handling your cat.  This parasite can also be found in raw or undercooked meat so this should also be avoided.  Colds, Sore Throats, Flu: Please check your medication sheet to see what you can take for symptoms.  If your symptoms are unrelieved by these medications please call the office.  Dental Work: Most any dental work Agricultural consultant recommends is permitted.  X-rays should only be taken during the first trimester if absolutely necessary.  Your abdomen should be shielded with a lead apron during all x-rays.  Please notify your provider prior to receiving any x-rays.  Novocaine is fine; gas is not recommended.  If your dentist requires a note from [place] prior to dental work please call the office and we will provide one for you.  Exercise: Exercise is an important part of staying healthy during your pregnancy.  You may continue most exercises you were accustomed to prior to pregnancy.  Later in your pregnancy you will most likely notice you have difficulty with activities requiring balance like riding a bicycle.  It is important that you listen to your body and avoid activities that put you at a higher risk of falling.  Adequate rest and staying well hydrated are a must!  If you have questions about the safety of specific activities ask your provider.    Exposure to Children with illness: Try to avoid obvious exposure; report any symptoms to [place] when noted,  If you have chicken pos, red measles or mumps, you should be immune to these diseases.   Please do not take any vaccines while pregnant unless you have checked with your OB provider.  Fetal Movement: After 28 weeks we recommend you do "kick counts" twice daily.  Lie or sit down in a calm quiet environment and count your baby movements "kicks".  You should feel your baby  at least 10 times per hour.  If you have not felt 10 kicks within the first hour get up, walk around and have something sweet to eat or drink then repeat for an additional hour.  If count remains less than 10 per hour notify your provider.  Fumigating: Follow your pest control agent's advice as to how long to stay out of your home.  Ventilate the area well before re-entering.  Hemorrhoids:   Most over-the-counter preparations can be used during pregnancy.  Check your medication to see what is safe to use.  It is important to use a stool softener or fiber in your diet and to drink lots of liquids.  If hemorrhoids seem to be getting worse please call the office.   Hot Tubs:  Hot tubs Jacuzzis and saunas are not recommended while pregnant.  These increase your internal body temperature and should be avoided.  Intercourse:  Sexual intercourse is safe during pregnancy as long as you are comfortable, unless otherwise advised by your provider.  Spotting may occur after intercourse; report any bright red bleeding that is heavier than spotting.  Labor:  If you know that you are in labor, please go to the hospital.  If you are unsure, please call the office and let us help you decide what to do.  Lifting, straining, etc:  If your job requires heavy lifting or straining please check with your provider for any limitations.  Generally, you should not lift items heavier than that you can lift simply with your hands and arms (no back muscles)  Painting:  Paint fumes do not harm your pregnancy, but may make you ill and should be avoided if possible.  Latex or water based paints have less odor than oils.  Use adequate ventilation while painting.  Permanents & Hair Color:  Chemicals in hair dyes are not recommended as they cause increase hair dryness which can increase hair loss during pregnancy.  " Highlighting" and permanents are allowed.  Dye may be absorbed differently and permanents may not hold as well during  pregnancy.  Sunbathing:  Use a sunscreen, as skin burns easily during pregnancy.  Drink plenty of fluids; avoid over heating.  Tanning Beds:  Because their possible side effects are still unknown, tanning beds are not recommended.  Ultrasound Scans:  Routine ultrasounds are performed at approximately 20 weeks.  You will be able to see your baby's general anatomy an if you would like to know the gender this can usually be determined as well.  If it is questionable when you conceived you may also receive an ultrasound early in your pregnancy for dating purposes.  Otherwise ultrasound exams are not routinely performed unless there is a medical necessity.  Although you can request a scan we ask that you pay for it when conducted because insurance does not cover " patient request" scans.  Work: If your pregnancy proceeds without complications you may work until your due date, unless your physician or employer advises otherwise.  Round Ligament Pain/Pelvic Discomfort:  Sharp, shooting pains not associated with bleeding are fairly common, usually occurring in the second trimester of pregnancy.  They tend to be worse when standing up or when you remain standing for long periods of time.  These are the result of pressure of certain pelvic ligaments called "round ligaments".  Rest, Tylenol and heat seem to be the most effective relief.  As the womb and fetus grow, they rise out of the pelvis and the discomfort improves.  Please notify the office if your pain seems different than that described.  It may represent a more serious condition.

## 2024-03-25 ENCOUNTER — Telehealth (INDEPENDENT_AMBULATORY_CARE_PROVIDER_SITE_OTHER)

## 2024-03-25 DIAGNOSIS — Z3480 Encounter for supervision of other normal pregnancy, unspecified trimester: Secondary | ICD-10-CM | POA: Insufficient documentation

## 2024-03-25 DIAGNOSIS — Z3687 Encounter for antenatal screening for uncertain dates: Secondary | ICD-10-CM

## 2024-03-25 NOTE — Progress Notes (Signed)
 New OB Intake  I connected with  Dawn Morris on 03/25/24 at 10:15 AM EST by telephone Video Visit and verified that I am speaking with the correct person using two identifiers. Nurse is located at [facility] and pt is located at home.  I discussed the limitations, risks, security and privacy concerns of performing an evaluation and management service by telephone and the availability of in person appointments. I also discussed with the patient that there may be a patient responsible charge related to this service. The patient expressed understanding and agreed to proceed.  I explained I am completing New OB Intake today. We discussed her EDD of 11/09/2024 that is based on LMP of 02/03/2024. Pt is G4/P1. I reviewed her allergies, medications, Medical/Surgical/OB history, and appropriate screenings. There are cats in the home: no. 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  Anatomy US11/02/2024 visit for [facility] VIDEO VISIT - Nurse OB Nurse Intake Appt LMP: 02/03/2024    Vital Signs   New Reading  Flowsheets No data found. Tobacco Smoking status: Never Passive exposure: Never Smokeless status: Never Reviewed: 03/15/2024 OB/Gyn Status Explained first scheduled US . Anatomy US  will be scheduled around [redacted] weeks gestational age.  Labs Discussed genetic screening with patient. Patient would like genetic testing to be drawn at new OB visit. Discussed possible labs to be drawn at new OB appointment.  COVID Vaccine Patient has not had COVID vaccine.   Social Determinants of Health Food  Insecurity: present [facility] Referral: Patient is not interested in referral to [facility].  Transportation: Patient denies transportation needs. Childcare: Discussed no children allowed at ultrasound appointments.   First visit review I reviewed new OB appt with pt. I explained she will have blood work and pap smear/pelvic exam if indicated. Explained pt will be seen by Eleanor Canny CNM. at first visit; encounter routed to appropriate provider.   Annalee VEAR Sanders, CMA 03/25/2024  10:34 AM

## 2024-03-25 NOTE — Addendum Note (Signed)
 Addended by: TAMEA ANNALEE DEL on: 03/25/2024 10:36 AM   Modules accepted: Orders

## 2024-04-17 ENCOUNTER — Ambulatory Visit

## 2024-04-17 DIAGNOSIS — Z3480 Encounter for supervision of other normal pregnancy, unspecified trimester: Secondary | ICD-10-CM

## 2024-04-17 DIAGNOSIS — Z3687 Encounter for antenatal screening for uncertain dates: Secondary | ICD-10-CM

## 2024-05-01 ENCOUNTER — Ambulatory Visit: Payer: Self-pay | Admitting: Obstetrics

## 2024-05-06 ENCOUNTER — Other Ambulatory Visit (HOSPITAL_COMMUNITY)
Admission: RE | Admit: 2024-05-06 | Discharge: 2024-05-06 | Disposition: A | Discharge status: Home / Self Care | Source: Ambulatory Visit | Attending: Obstetrics | Admitting: Obstetrics

## 2024-05-06 ENCOUNTER — Encounter: Payer: Self-pay | Admitting: Licensed Practical Nurse

## 2024-05-06 ENCOUNTER — Ambulatory Visit (INDEPENDENT_AMBULATORY_CARE_PROVIDER_SITE_OTHER): Admitting: Obstetrics

## 2024-05-06 VITALS — BP 121/85 | HR 89 | Wt 168.0 lb

## 2024-05-06 DIAGNOSIS — Z113 Encounter for screening for infections with a predominantly sexual mode of transmission: Secondary | ICD-10-CM

## 2024-05-06 DIAGNOSIS — Z3A13 13 weeks gestation of pregnancy: Secondary | ICD-10-CM | POA: Diagnosis present

## 2024-05-06 DIAGNOSIS — O09291 Supervision of pregnancy with other poor reproductive or obstetric history, first trimester: Secondary | ICD-10-CM | POA: Diagnosis not present

## 2024-05-06 DIAGNOSIS — Z1379 Encounter for other screening for genetic and chromosomal anomalies: Secondary | ICD-10-CM

## 2024-05-06 DIAGNOSIS — Z8632 Personal history of gestational diabetes: Secondary | ICD-10-CM

## 2024-05-06 DIAGNOSIS — O0932 Supervision of pregnancy with insufficient antenatal care, second trimester: Secondary | ICD-10-CM | POA: Diagnosis present

## 2024-05-06 DIAGNOSIS — O09299 Supervision of pregnancy with other poor reproductive or obstetric history, unspecified trimester: Secondary | ICD-10-CM | POA: Insufficient documentation

## 2024-05-06 DIAGNOSIS — Z8759 Personal history of other complications of pregnancy, childbirth and the puerperium: Secondary | ICD-10-CM | POA: Insufficient documentation

## 2024-05-06 MED ORDER — ASPIRIN 81 MG PO TBEC: 81.0000 mg | DELAYED_RELEASE_TABLET | Freq: Every day | ORAL | 2 refills | Status: AC
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
## 2024-05-07 LAB — CBC/D/PLT+RPR+RH+ABO+RUBIGG...
Antibody Screen: NEGATIVE
Basophils Absolute: 0 x10E3/uL (ref 0.0–0.2)
Basos: 0 %
EOS (ABSOLUTE): 0.1 x10E3/uL (ref 0.0–0.4)
Eos: 2 %
HCV Ab: NONREACTIVE
HIV Screen 4th Generation wRfx: NONREACTIVE
Hematocrit: 34.7 % (ref 34.0–46.6)
Hemoglobin: 11.4 g/dL (ref 11.1–15.9)
Hepatitis B Surface Ag: NEGATIVE
Immature Grans (Abs): 0 x10E3/uL (ref 0.0–0.1)
Immature Granulocytes: 0 %
Lymphocytes Absolute: 1.7 x10E3/uL (ref 0.7–3.1)
Lymphs: 22 %
MCH: 31.2 pg (ref 26.6–33.0)
MCHC: 32.9 g/dL (ref 31.5–35.7)
MCV: 95 fL (ref 79–97)
Monocytes Absolute: 0.5 x10E3/uL (ref 0.1–0.9)
Monocytes: 7 %
Neutrophils Absolute: 5.1 x10E3/uL (ref 1.4–7.0)
Neutrophils: 69 %
Platelets: 218 x10E3/uL (ref 150–450)
RBC: 3.65 x10E6/uL — ABNORMAL LOW (ref 3.77–5.28)
RDW: 12.9 % (ref 11.7–15.4)
RPR Ser Ql: NONREACTIVE
Rh Factor: POSITIVE
Rubella Antibodies, IGG: 5.49 {index}
Varicella zoster IgG: REACTIVE
WBC: 7.5 x10E3/uL (ref 3.4–10.8)

## 2024-05-07 LAB — URINALYSIS, ROUTINE W REFLEX MICROSCOPIC
Bilirubin, UA: NEGATIVE
Glucose, UA: NEGATIVE
Ketones, UA: NEGATIVE
Leukocytes,UA: NEGATIVE
Nitrite, UA: NEGATIVE
Protein,UA: NEGATIVE
RBC, UA: NEGATIVE
Specific Gravity, UA: 1.013 (ref 1.005–1.030)
Urobilinogen, Ur: 0.2 mg/dL (ref 0.2–1.0)
pH, UA: 7 (ref 5.0–7.5)

## 2024-05-07 LAB — COMPREHENSIVE METABOLIC PANEL WITH GFR
ALT: 16 IU/L (ref 0–32)
AST: 15 IU/L (ref 0–40)
Albumin: 4.2 g/dL (ref 3.9–4.9)
Alkaline Phosphatase: 47 IU/L (ref 41–116)
BUN/Creatinine Ratio: 12 (ref 9–23)
BUN: 7 mg/dL (ref 6–20)
Bilirubin Total: 0.3 mg/dL (ref 0.0–1.2)
CO2: 18 mmol/L — ABNORMAL LOW (ref 20–29)
Calcium: 8.7 mg/dL (ref 8.7–10.2)
Chloride: 102 mmol/L (ref 96–106)
Creatinine, Ser: 0.58 mg/dL (ref 0.57–1.00)
Globulin, Total: 2.1 g/dL (ref 1.5–4.5)
Glucose: 84 mg/dL (ref 70–99)
Potassium: 3.9 mmol/L (ref 3.5–5.2)
Sodium: 134 mmol/L (ref 134–144)
Total Protein: 6.3 g/dL (ref 6.0–8.5)
eGFR: 122 mL/min/1.73

## 2024-05-07 LAB — PROTEIN / CREATININE RATIO, URINE
Creatinine, Urine: 53.5 mg/dL
Protein, Ur: 6.8 mg/dL
Protein/Creat Ratio: 127 mg/g{creat} (ref 0–200)

## 2024-05-07 LAB — CERVICOVAGINAL ANCILLARY ONLY
Chlamydia: NEGATIVE
Comment: NEGATIVE
Comment: NORMAL
Neisseria Gonorrhea: NEGATIVE

## 2024-05-07 LAB — HEMOGLOBIN A1C
Est. average glucose Bld gHb Est-mCnc: 94 mg/dL
Hgb A1c MFr Bld: 4.9 % (ref 4.8–5.6)

## 2024-05-07 LAB — HCV INTERPRETATION

## 2024-05-08 LAB — CULTURE, OB URINE

## 2024-05-08 LAB — URINE CULTURE, OB REFLEX

## 2024-05-10 ENCOUNTER — Ambulatory Visit: Payer: Self-pay | Admitting: Obstetrics

## 2024-05-10 LAB — MATERNIT 21 PLUS CORE, BLOOD
Fetal Fraction: 19
Result (T21): NEGATIVE
Trisomy 13 (Patau syndrome): NEGATIVE
Trisomy 18 (Edwards syndrome): NEGATIVE
Trisomy 21 (Down syndrome): NEGATIVE

## 2024-05-11 LAB — MONITOR DRUG PROFILE 14(MW)
Amphetamine Scrn, Ur: NEGATIVE ng/mL
BARBITURATE SCREEN URINE: NEGATIVE ng/mL
BENZODIAZEPINE SCREEN, URINE: NEGATIVE ng/mL
Buprenorphine, Urine: NEGATIVE ng/mL
CANNABINOIDS UR QL SCN: NEGATIVE ng/mL
Creatinine(Crt), U: 50.8 mg/dL (ref 20.0–300.0)
Fentanyl, Urine: NEGATIVE pg/mL
Meperidine Screen, Urine: NEGATIVE ng/mL
Methadone Screen, Urine: NEGATIVE ng/mL
OXYCODONE+OXYMORPHONE UR QL SCN: NEGATIVE ng/mL
Opiate Scrn, Ur: NEGATIVE ng/mL
Ph of Urine: 6.7 (ref 4.5–8.9)
Phencyclidine Qn, Ur: NEGATIVE ng/mL
Propoxyphene Scrn, Ur: NEGATIVE ng/mL
SPECIFIC GRAVITY: 1.014
Tramadol Screen, Urine: NEGATIVE ng/mL

## 2024-05-11 LAB — NICOTINE SCREEN, URINE: Cotinine Ql Scrn, Ur: POSITIVE ng/mL — AB

## 2024-05-11 LAB — COCAINE (GC/MS), URINE
BENZOYLECGONINE (GC/MS): 3360 ng/mL
COCAINE + METABOLITE: POSITIVE — AB

## 2024-05-13 ENCOUNTER — Encounter: Payer: Self-pay | Admitting: Licensed Practical Nurse

## 2024-05-20 ENCOUNTER — Encounter: Payer: Self-pay | Admitting: Certified Nurse Midwife

## 2024-06-03 ENCOUNTER — Encounter: Admitting: Licensed Practical Nurse

## 2024-06-06 NOTE — Patient Instructions (Signed)
 Second Trimester of Pregnancy  The second trimester of pregnancy is from week 14 through week 27. This is months 4 through 6 of pregnancy. During the second trimester: Morning sickness is less or has stopped. You may have more energy. You may feel hungry more often. At this time, your unborn baby is growing very fast. At the end of the sixth month, the unborn baby may be up to 12 inches long and weigh about 1 pounds. You will likely start to feel the baby move between 16 and 20 weeks of pregnancy. Body changes during your second trimester Your body continues to change during this time. The changes usually go away after your baby is born. Physical changes You will gain more weight. Your belly will get bigger. You may begin to get stretch marks on your hips, belly, and breasts. Your breasts will keep growing and may hurt. You may get dark spots or blotches on your face. A dark line from your belly button to the pubic area may appear. This line is called linea nigra. Your hair may grow faster and get thicker. Health changes You may have headaches. You may have heartburn. You may pee more often. You may have swollen, bulging veins (varicose veins). You may have trouble pooping (constipation), or swollen veins in the butt that can itch or get painful (hemorrhoids). You may have back pain. This is caused by: Weight gain. Pregnancy hormones that are relaxing the joints in your pelvis. Follow these instructions at home: Medicines Talk to your health care provider if you're taking medicines. Ask if the medicines are safe to take during pregnancy. Your provider may change the medicines that you take. Do not take any medicines unless told to by your provider. Take a prenatal vitamin that has at least 600 micrograms (mcg) of folic acid. Do not use herbal medicines, illegal drugs, or medicines that are not approved by your provider. Eating and drinking While you're pregnant your body needs  extra food for your growing baby. Talk with your provider about what to eat while pregnant. Activity Most women are able to exercise during pregnancy. Exercises may need to change as your pregnancy goes on. Talk to your provider about your activities and exercise routines. Relieving pain and discomfort Wear a good, supportive bra if your breasts hurt. Rest with your legs raised if you have leg cramps or low back pain. Take warm sitz baths to soothe pain from hemorrhoids. Use hemorrhoid cream if your provider says it's okay. Do not douche. Do not use tampons or scented pads. Do not use hot tubs, steam rooms, or saunas. Safety Wear your seatbelt at all times when you're in a car. Talk to your provider if someone hits you, hurts you, or yells at you. Talk with your provider if you're feeling sad or have thoughts of hurting yourself. Lifestyle Certain things can be harmful while you're pregnant. It's best to avoid the following: Do not drink alcohol,smoke, vape, or use products with nicotine or tobacco in them. If you need help quitting, talk with your provider. Avoid cat litter boxes and soil used by cats. These things carry germs that can cause harm to your pregnancy and your baby. General instructions Keep all follow-up visits. It helps you and your unborn baby stay as healthy as possible. Write down your questions. Take them to your prenatal visits. Your provider will: Talk with you about your overall health. Give you advice or refer you to specialists who can help with different needs,  including: Prenatal education classes. Mental health and counseling. Foods and healthy eating. Ask for help if you need help with food. Where to find more information American Pregnancy Association: americanpregnancy.org [employer]ians [employer] Gynecologists: acog.org Office on [employer] Health: [employer] Contact a health care provider if: You have a headache that does not go away  when you take medicine. You have any of these problems: You can't eat or drink. You throw up or feel like you may throw up. You have watery poop (diarrhea) for 2 days or more. You have pain when you pee or your pee smells bad. You have been sick for 2 days or more and are not getting better. Contact your provider right away if: You have any of these coming from your vagina: Abnormal discharge. Bad-smelling fluid. Bleeding. Your baby is moving less than usual. You have contractions, belly cramping, or have pain in your pelvis or lower back. You have symptoms of high blood pressure or preeclampsia. These include: A severe, throbbing headache that does not go away. Sudden or extreme swelling of your face, hands, legs, or feet. Vision problems: You see spots. You have blurry vision. Your eyes are sensitive to light. If you can't reach the provider, go to an urgent care or emergency room. Get help right away if: You faint, become confused, or can't think clearly. You have chest pain or trouble breathing. You have any kind of injury, such as from a fall or a car crash. These symptoms may be an emergency. Call 911 right away. Do not wait to see if the symptoms will go away. Do not drive yourself to the hospital. This information is not intended to replace advice given to you by your health care provider. Make sure you discuss any questions you have with your health care provider. Document Revised: 02/02/2023 Document Reviewed: 09/02/2022 Elsevier Patient Education  2024 [employer].

## 2024-06-07 ENCOUNTER — Ambulatory Visit: Payer: Self-pay | Admitting: Certified Nurse Midwife

## 2024-06-07 VITALS — BP 113/80 | HR 77 | Wt 172.5 lb

## 2024-06-07 DIAGNOSIS — N949 Unspecified condition associated with female genital organs and menstrual cycle: Secondary | ICD-10-CM

## 2024-06-07 DIAGNOSIS — O26892 Other specified pregnancy related conditions, second trimester: Secondary | ICD-10-CM

## 2024-06-07 DIAGNOSIS — R102 Pelvic and perineal pain unspecified side: Secondary | ICD-10-CM

## 2024-06-07 DIAGNOSIS — Z362 Encounter for other antenatal screening follow-up: Secondary | ICD-10-CM

## 2024-06-07 DIAGNOSIS — Z3A17 17 weeks gestation of pregnancy: Secondary | ICD-10-CM

## 2024-06-07 DIAGNOSIS — Z3482 Encounter for supervision of other normal pregnancy, second trimester: Secondary | ICD-10-CM

## 2024-06-07 NOTE — Progress Notes (Signed)
 ROB , feeling movement. Having significant round ligament pain and pelvic pain which is affecting her ability to move, ambulate, get out of bed. Orders placed for physical therapy. Reassurance given. Self help measures reviewed. Discussed u/s for anatomy next appointment. She is in agreement. Red flag symptoms reviewed. Follow up 3 weeks for u/s and ROB.   Dawn Morris, CNM

## 2024-06-28 ENCOUNTER — Other Ambulatory Visit: Payer: Self-pay

## 2024-06-28 ENCOUNTER — Encounter: Admitting: Licensed Practical Nurse
